# Patient Record
Sex: Male | Born: 1960
Health system: Southern US, Community
[De-identification: ages and names within clinical notes are randomized; demographics above are authoritative.]

## PROBLEM LIST (undated history)

## (undated) HISTORY — PX: COLONOSCOPY: SHX174

---

## 2013-12-12 ENCOUNTER — Other Ambulatory Visit: Payer: Self-pay | Admitting: Family Medicine

## 2013-12-12 ENCOUNTER — Ambulatory Visit
Admission: RE | Admit: 2013-12-12 | Discharge: 2013-12-12 | Disposition: A | Discharge status: Home / Self Care | Payer: BC Managed Care – PPO | Source: Ambulatory Visit | Attending: Family Medicine | Admitting: Family Medicine

## 2013-12-12 DIAGNOSIS — R0789 Other chest pain: Secondary | ICD-10-CM

## 2014-07-23 ENCOUNTER — Other Ambulatory Visit (INDEPENDENT_AMBULATORY_CARE_PROVIDER_SITE_OTHER): Payer: Self-pay | Admitting: General Surgery

## 2014-07-23 ENCOUNTER — Other Ambulatory Visit: Payer: Self-pay | Admitting: *Deleted

## 2014-07-23 DIAGNOSIS — K8020 Calculus of gallbladder without cholecystitis without obstruction: Secondary | ICD-10-CM

## 2014-07-23 NOTE — Addendum Note (Signed)
Addended by: Jaivion Kingsley M on: 07/23/2014 03:27 PM   Modules accepted: Orders  

## 2014-08-07 ENCOUNTER — Ambulatory Visit (HOSPITAL_COMMUNITY)
Admission: RE | Admit: 2014-08-07 | Discharge: 2014-08-07 | Disposition: A | Discharge status: Home / Self Care | Payer: BLUE CROSS/BLUE SHIELD | Source: Ambulatory Visit | Attending: General Surgery | Admitting: General Surgery

## 2014-08-07 DIAGNOSIS — K8020 Calculus of gallbladder without cholecystitis without obstruction: Secondary | ICD-10-CM | POA: Insufficient documentation

## 2014-08-07 MED ORDER — SINCALIDE 5 MCG IJ SOLR
INTRAMUSCULAR | Status: AC
  Administered 2014-08-07: 2.74 ug via INTRAVENOUS
  Filled 2014-08-07: qty 10

## 2014-08-07 MED ORDER — TECHNETIUM TC 99M MEBROFENIN IV KIT
5.0000 | PACK | Freq: Once | INTRAVENOUS | Status: AC | PRN
  Administered 2014-08-07: 5 via INTRAVENOUS

## 2014-08-07 MED ORDER — SINCALIDE 5 MCG IJ SOLR
0.0200 ug/kg | Freq: Once | INTRAMUSCULAR | Status: AC
  Administered 2014-08-07: 2.74 ug via INTRAVENOUS

## 2015-09-11 DIAGNOSIS — S70362A Insect bite (nonvenomous), left thigh, initial encounter: Secondary | ICD-10-CM | POA: Diagnosis not present

## 2015-09-11 DIAGNOSIS — J069 Acute upper respiratory infection, unspecified: Secondary | ICD-10-CM | POA: Diagnosis not present

## 2015-11-14 DIAGNOSIS — L308 Other specified dermatitis: Secondary | ICD-10-CM | POA: Diagnosis not present

## 2016-02-26 DIAGNOSIS — Z23 Encounter for immunization: Secondary | ICD-10-CM | POA: Diagnosis not present

## 2016-04-17 DIAGNOSIS — M25561 Pain in right knee: Secondary | ICD-10-CM | POA: Diagnosis not present

## 2016-04-17 DIAGNOSIS — M25562 Pain in left knee: Secondary | ICD-10-CM | POA: Diagnosis not present

## 2016-11-23 DIAGNOSIS — J209 Acute bronchitis, unspecified: Secondary | ICD-10-CM | POA: Diagnosis not present

## 2020-07-29 ENCOUNTER — Other Ambulatory Visit (HOSPITAL_COMMUNITY): Payer: Self-pay | Admitting: Family Medicine

## 2020-07-29 DIAGNOSIS — R1011 Right upper quadrant pain: Secondary | ICD-10-CM | POA: Diagnosis not present

## 2020-07-29 DIAGNOSIS — M545 Low back pain, unspecified: Secondary | ICD-10-CM | POA: Diagnosis not present

## 2020-08-07 ENCOUNTER — Other Ambulatory Visit: Payer: Self-pay

## 2020-08-07 ENCOUNTER — Ambulatory Visit (HOSPITAL_COMMUNITY)
Admission: RE | Admit: 2020-08-07 | Discharge: 2020-08-07 | Disposition: A | Discharge status: Home / Self Care | Payer: BC Managed Care – PPO | Source: Ambulatory Visit | Attending: Family Medicine | Admitting: Family Medicine

## 2020-08-07 DIAGNOSIS — K8020 Calculus of gallbladder without cholecystitis without obstruction: Secondary | ICD-10-CM | POA: Diagnosis not present

## 2020-08-07 DIAGNOSIS — R1011 Right upper quadrant pain: Secondary | ICD-10-CM | POA: Insufficient documentation

## 2020-09-11 ENCOUNTER — Ambulatory Visit: Payer: Self-pay | Admitting: Surgery

## 2020-09-11 DIAGNOSIS — K8010 Calculus of gallbladder with chronic cholecystitis without obstruction: Secondary | ICD-10-CM | POA: Diagnosis not present

## 2020-09-11 DIAGNOSIS — Z8379 Family history of other diseases of the digestive system: Secondary | ICD-10-CM | POA: Diagnosis not present

## 2020-10-03 NOTE — Patient Instructions (Addendum)
DUE TO COVID-19 ONLY ONE VISITOR IS ALLOWED TO COME WITH YOU AND STAY IN THE WAITING ROOM ONLY DURING PRE OP AND PROCEDURE DAY OF SURGERY. THE 1 VISITOR  MAY VISIT WITH YOU AFTER SURGERY IN YOUR PRIVATE ROOM DURING VISITING HOURS ONLY!  YOU NEED TO HAVE A COVID 19 TEST ON: 10/08/20 @ 9:00 AM  , THIS TEST MUST BE DONE BEFORE SURGERY,  COVID TESTING SITE [address] [place]  [id number], IT IS ON THE RIGHT GOING OUT [address] APPROXIMATELY  2 MINUTES PAST [employer] ON THE RIGHT. ONCE YOUR COVID TEST IS COMPLETED,  PLEASE BEGIN THE QUARANTINE INSTRUCTIONS AS OUTLINED IN YOUR HANDOUT.                Connor Wang   Your procedure is scheduled on: 10/11/20   Report to [facility] Main  Entrance   Report to admitting at: 7:15 AM     Call this number if you have problems the morning of surgery [phone]    Remember: Do not eat food or drink liquids :After Midnight.   BRUSH YOUR TEETH MORNING OF SURGERY AND RINSE YOUR MOUTH OUT, NO CHEWING GUM CANDY OR MINTS.    Take these medicines the morning of surgery with A SIP OF WATER:   DO NOT TAKE ANY DIABETIC MEDICATIONS DAY OF YOUR SURGERY                               You may not have any metal on your body including hair pins and              piercings  Do not wear jewelry, lotions, powders or perfumes, deodorant             Men may shave face and neck.   Do not bring valuables to the hospital. [facility] IS NOT             RESPONSIBLE   FOR VALUABLES.  Contacts, dentures or bridgework may not be worn into surgery.  Leave suitcase in the car. After surgery it may be brought to your room.     Patients discharged the day of surgery will not be allowed to drive home. IF YOU ARE HAVING SURGERY AND GOING HOME THE SAME DAY, YOU MUST HAVE AN ADULT TO DRIVE YOU HOME AND BE WITH YOU FOR 24 HOURS. YOU MAY GO HOME BY TAXI OR UBER OR ORTHERWISE, BUT AN ADULT MUST ACCOMPANY YOU HOME AND STAY WITH YOU FOR 24  HOURS.  Name and phone number of your driver:  Special Instructions: N/A              Please read over the following fact sheets you were given: _____________________________________________________________________          [facility] - Preparing for Surgery Before surgery, you can play an important role.  Because skin is not sterile, your skin needs to be as free of germs as possible.  You can reduce the number of germs on your skin by washing with CHG (chlorahexidine gluconate) soap before surgery.  CHG is an antiseptic cleaner which kills germs and bonds with the skin to continue killing germs even after washing. Please DO NOT use if you have an allergy to CHG or antibacterial soaps.  If your skin becomes reddened/irritated stop using the CHG and inform your nurse when you arrive at Short Stay. Do not shave (including  legs and underarms) for at least 48 hours prior to the first CHG shower.  You may shave your face/neck. Please follow these instructions carefully:  1.  Shower with CHG Soap the night before surgery and the  morning of Surgery.  2.  If you choose to wash your hair, wash your hair first as usual with your  normal  shampoo.  3.  After you shampoo, rinse your hair and body thoroughly to remove the  shampoo.                           4.  Use CHG as you would any other liquid soap.  You can apply chg directly  to the skin and wash                       Gently with a scrungie or clean washcloth.  5.  Apply the CHG Soap to your body ONLY FROM THE NECK DOWN.   Do not use on face/ open                           Wound or open sores. Avoid contact with eyes, ears mouth and genitals (private parts).                       Wash face,  Genitals (private parts) with your normal soap.             6.  Wash thoroughly, paying special attention to the area where your surgery  will be performed.  7.  Thoroughly rinse your body with warm water from the neck down.  8.  DO NOT shower/wash with your  normal soap after using and rinsing off  the CHG Soap.                9.  Pat yourself dry with a clean towel.            10.  Wear clean pajamas.            11.  Place clean sheets on your bed the night of your first shower and do not  sleep with pets. Day of Surgery : Do not apply any lotions/deodorants the morning of surgery.  Please wear clean clothes to the hospital/surgery center.  FAILURE TO FOLLOW THESE INSTRUCTIONS MAY RESULT IN THE CANCELLATION OF YOUR SURGERY PATIENT SIGNATURE_________________________________  NURSE SIGNATURE__________________________________  ________________________________________________________________________

## 2020-10-04 ENCOUNTER — Encounter (HOSPITAL_COMMUNITY)
Admission: RE | Admit: 2020-10-04 | Discharge: 2020-10-04 | Disposition: A | Discharge status: Home / Self Care | Payer: BC Managed Care – PPO | Source: Ambulatory Visit | Attending: Surgery | Admitting: Surgery

## 2020-10-04 ENCOUNTER — Other Ambulatory Visit: Payer: Self-pay

## 2020-10-04 ENCOUNTER — Encounter (HOSPITAL_COMMUNITY): Payer: Self-pay

## 2020-10-04 DIAGNOSIS — Z01812 Encounter for preprocedural laboratory examination: Secondary | ICD-10-CM | POA: Insufficient documentation

## 2020-10-04 LAB — CBC
HCT: 45.3 % (ref 39.0–52.0)
Hemoglobin: 15.1 g/dL (ref 13.0–17.0)
MCH: 30.2 pg (ref 26.0–34.0)
MCHC: 33.3 g/dL (ref 30.0–36.0)
MCV: 90.6 fL (ref 80.0–100.0)
Platelets: 206 10*3/uL (ref 150–400)
RBC: 5 MIL/uL (ref 4.22–5.81)
RDW: 12.9 % (ref 11.5–15.5)
WBC: 7.9 10*3/uL (ref 4.0–10.5)
nRBC: 0 % (ref 0.0–0.2)

## 2020-10-04 NOTE — Progress Notes (Signed)
COVID Vaccine Completed: Date COVID Vaccine completed: COVID vaccine manufacturer: Pfizer    Moderna   [employer]   PCP - Dr. Leodis Sias Cardiologist -   Chest x-ray -  EKG -  Stress Test -  ECHO -  Cardiac Cath -  Pacemaker/ICD device last checked:  Sleep Study -  CPAP -   Fasting Blood Sugar -  Checks Blood Sugar _____ times a day  Blood Thinner Instructions: Aspirin Instructions: Last Dose:  Anesthesia review:   Patient denies shortness of breath, fever, cough and chest pain at PAT appointment   Patient verbalized understanding of instructions that were given to them at the PAT appointment. Patient was also instructed that they will need to review over the PAT instructions again at home before surgery.

## 2020-10-04 NOTE — Progress Notes (Signed)
Pt. Refuses any blood product.

## 2020-10-06 ENCOUNTER — Encounter (HOSPITAL_COMMUNITY): Payer: Self-pay | Admitting: Surgery

## 2020-10-06 DIAGNOSIS — K8010 Calculus of gallbladder with chronic cholecystitis without obstruction: Secondary | ICD-10-CM | POA: Diagnosis present

## 2020-10-06 NOTE — H&P (Signed)
General Surgery [facility]  Connor Wang DOB: 03-02-1961 Married / Language: [place] / Race: White Male   History of Present Illness  The patient is a 60 year old male who presents for evaluation of gall stones.  CHIEF COMPLAINT: symptomatic cholelithiasis, chronic cholecystitis  Patient is referred by Dr. Leodis Sias for surgical evaluation and management of symptomatic cholelithiasis and chronic cholecystitis. Patient began having symptoms approximately 3 months ago. He describes intermittent right upper quadrant abdominal pain radiating to the back. Patient underwent ultrasound on August 07, 2020. This showed multiple gallstones. There was also prominence of the common bile duct at 8 mm. There was increased echogenicity in the liver consistent with steatosis. Patient has had no prior abdominal surgery. There is a family history of cholecystectomy and the patient's mother. Patient denies any history of hepatobiliary or pancreatic disease. He denies any history of jaundice. He works in Consulting civil engineer for [employer].   Problem List/Past Medical GALLSTONES (K80.20)  BACK PAIN WITHOUT RADIATION (M54.9)  BMI 40.0-44.9, ADULT (Z68.41)  FAMILY HISTORY OF CHOLECYSTECTOMY (Z83.79)  CHOLELITHIASIS WITH CHRONIC CHOLECYSTITIS (K80.10)   Past Surgical History  Colon Polyp Removal - Colonoscopy  Oral Surgery   Diagnostic Studies History Colonoscopy  5-10 years ago within last year  Allergies  No Known Drug Allergies  Allergies Reconciled   Medication History  No Current Medications Medications Reconciled  Social History  Alcohol use  Occasional alcohol use. Caffeine use  Coffee, Tea. Tobacco use  Never smoker.  Family History  Arthritis  Mother. Cancer  Father, Son. Cerebrovascular Accident  Father. Hypertension  Mother. Kidney Disease  Father. Arthritis  Father, Mother. Depression  Daughter, Son. Heart Disease   Mother. Respiratory Condition  Sister.  Other Problems  No pertinent past medical history  Back Pain  Cholelithiasis  Diverticulosis  High blood pressure  Hypercholesterolemia  Other disease, cancer, significant illness  Sleep Apnea   Review of Systems  General Not Present- Appetite Loss, Chills, Fatigue, Fever, Night Sweats, Weight Gain and Weight Loss. Skin Not Present- Change in Wart/Mole, Dryness, Hives, Jaundice, New Lesions, Non-Healing Wounds, Rash and Ulcer. HEENT Present- Wears glasses/contact lenses. Not Present- Earache, Hearing Loss, Hoarseness, Nose Bleed, Oral Ulcers, Ringing in the Ears, Seasonal Allergies, Sinus Pain, Sore Throat, Visual Disturbances and Yellow Eyes. Respiratory Present- Snoring. Not Present- Bloody sputum, Chronic Cough, Difficulty Breathing and Wheezing. Breast Not Present- Breast Mass, Breast Pain, Nipple Discharge and Skin Changes. Cardiovascular Present- Rapid Heart Rate. Not Present- Chest Pain, Difficulty Breathing Lying Down, Leg Cramps, Palpitations, Shortness of Breath and Swelling of Extremities. Gastrointestinal Present- Abdominal Pain. Not Present- Bloating, Bloody Stool, Change in Bowel Habits, Chronic diarrhea, Constipation, Difficulty Swallowing, Excessive gas, Gets full quickly at meals, Hemorrhoids, Indigestion, Nausea, Rectal Pain and Vomiting. Male Genitourinary Not Present- Blood in Urine, Change in Urinary Stream, Frequency, Impotence, Nocturia, Painful Urination, Urgency and Urine Leakage. Musculoskeletal Present- Joint Pain and Joint Stiffness. Not Present- Back Pain, Muscle Pain, Muscle Weakness and Swelling of Extremities. Neurological Present- Numbness and Tingling. Not Present- Decreased Memory, Fainting, Headaches, Seizures, Tremor, Trouble walking and Weakness. Psychiatric Not Present- Anxiety, Bipolar, Change in Sleep Pattern, Depression, Fearful and Frequent crying. Hematology Not Present- Blood Thinners, Easy  Bruising, Excessive bleeding, Gland problems, HIV and Persistent Infections.  Vitals  Weight: 276.25 lb Height: 70in Body Surface Area: 2.39 m Body Mass Index: 39.64 kg/m  Temp.: 97.33F  Pulse: 59 (Regular)  P.OX: 98% (Room air) BP: 140/80(Sitting, Left Arm, Standard)  Physical Exam  GENERAL APPEARANCE Development: normal Nutritional status: normal Gross deformities: none  SKIN Rash, lesions, ulcers: none Induration, erythema: none Nodules: none palpable  EYES Conjunctiva and lids: normal Pupils: equal and reactive Iris: normal bilaterally  EARS, NOSE, MOUTH, THROAT External ears: no lesion or deformity External nose: no lesion or deformity Hearing: grossly normal Due to Covid-19 pandemic, patient is wearing a mask.  NECK Symmetric: yes Trachea: midline Thyroid: no palpable nodules in the thyroid bed  CHEST Respiratory effort: normal Retraction or accessory muscle use: no Breath sounds: normal bilaterally Rales, rhonchi, wheeze: none  CARDIOVASCULAR Auscultation: regular rhythm, normal rate Murmurs: none Pulses: radial pulse 2+ palpable Lower extremity edema: none  ABDOMEN Distension: none Masses: none palpable Tenderness: Mild tenderness to palpation right upper quadrant without guarding Hepatosplenomegaly: not present Hernia: not present; mild rectus diastasis upper midline abdominal wall  MUSCULOSKELETAL Station and gait: normal Digits and nails: no clubbing or cyanosis Muscle strength: grossly normal all extremities Range of motion: grossly normal all extremities Deformity: none  LYMPHATIC Cervical: none palpable Supraclavicular: none palpable  PSYCHIATRIC Oriented to person, place, and time: yes Mood and affect: normal for situation Judgment and insight: appropriate for situation    Assessment & Plan   CHOLELITHIASIS WITH CHRONIC CHOLECYSTITIS (K80.10) FAMILY HISTORY OF CHOLECYSTECTOMY (Z83.79)  Patient is referred by  his primary care physician for surgical evaluation and management of symptomatic cholelithiasis and chronic cholecystitis.  Patient is provided with written literature on laparoscopic cholecystectomy to review at home.  Ultrasound demonstrates multiple gallstones as well as a mildly prominent common bile duct. I have recommended proceeding with laparoscopic cholecystectomy with intraoperative cholangiography. We have discussed the procedure. We have discussed the size and location of the surgical incisions. We discussed the possibility of conversion to open surgery. We discussed the hospital stay to be anticipated. The patient understands and wishes to proceed with surgery in the near future.  The risks and benefits of the procedure have been discussed at length with the patient. The patient understands the proposed procedure, potential alternative treatments, and the course of recovery to be expected. All of the patient's questions have been answered at this time. The patient wishes to proceed with surgery.  Darnell Level, MD [facility] Office: [phone]

## 2020-10-08 ENCOUNTER — Other Ambulatory Visit (HOSPITAL_COMMUNITY)
Admission: RE | Admit: 2020-10-08 | Discharge: 2020-10-08 | Disposition: A | Discharge status: Home / Self Care | Payer: BC Managed Care – PPO | Source: Ambulatory Visit | Attending: Surgery | Admitting: Surgery

## 2020-10-08 DIAGNOSIS — Z20822 Contact with and (suspected) exposure to covid-19: Secondary | ICD-10-CM | POA: Diagnosis not present

## 2020-10-08 DIAGNOSIS — Z01812 Encounter for preprocedural laboratory examination: Secondary | ICD-10-CM | POA: Insufficient documentation

## 2020-10-08 LAB — SARS CORONAVIRUS 2 (TAT 6-24 HRS): SARS Coronavirus 2: NEGATIVE

## 2020-10-10 ENCOUNTER — Encounter (HOSPITAL_COMMUNITY): Payer: Self-pay | Admitting: Surgery

## 2020-10-10 NOTE — Anesthesia Preprocedure Evaluation (Addendum)
Anesthesia Evaluation  Patient identified by MRN, date of birth, ID band Patient awake    Reviewed: Allergy & Precautions, NPO status , Patient's Chart, lab work & pertinent test results  History of Anesthesia Complications Negative for: history of anesthetic complications  Airway Mallampati: III  TM Distance: >3 FB Neck ROM: Full    Dental no notable dental hx.    Pulmonary neg pulmonary ROS,    Pulmonary exam normal        Cardiovascular negative cardio ROS Normal cardiovascular exam     Neuro/Psych negative neurological ROS     GI/Hepatic Neg liver ROS,   Endo/Other  negative endocrine ROS  Renal/GU negative Renal ROS     Musculoskeletal negative musculoskeletal ROS (+)   Abdominal   Peds  Hematology negative hematology ROS (+)   Anesthesia Other Findings   Reproductive/Obstetrics                            Anesthesia Physical Anesthesia Plan  ASA: 2  Anesthesia Plan: General   Post-op Pain Management:    Induction: Intravenous  PONV Risk Score and Plan: 4 or greater and Ondansetron, Dexamethasone, Midazolam and Scopolamine patch - Pre-op  Airway Management Planned:   Additional Equipment:   Intra-op Plan:   Post-operative Plan: Extubation in OR  Informed Consent: I have reviewed the patients History and Physical, chart, labs and discussed the procedure including the risks, benefits and alternatives for the proposed anesthesia with the patient or authorized representative who has indicated his/her understanding and acceptance.     Dental advisory given  Plan Discussed with: Anesthesiologist, CRNA and Surgeon  Anesthesia Plan Comments:        Anesthesia Quick Evaluation

## 2020-10-11 ENCOUNTER — Ambulatory Visit (HOSPITAL_COMMUNITY): Payer: BC Managed Care – PPO

## 2020-10-11 ENCOUNTER — Ambulatory Visit (HOSPITAL_COMMUNITY): Payer: BC Managed Care – PPO | Admitting: Anesthesiology

## 2020-10-11 ENCOUNTER — Encounter (HOSPITAL_COMMUNITY)
Admission: RE | Disposition: A | Discharge status: Home / Self Care | Payer: Self-pay | Source: Ambulatory Visit | Attending: Surgery

## 2020-10-11 ENCOUNTER — Ambulatory Visit (HOSPITAL_COMMUNITY)
Admission: RE | Admit: 2020-10-11 | Discharge: 2020-10-12 | Disposition: A | Discharge status: Home / Self Care | Payer: BC Managed Care – PPO | Source: Ambulatory Visit | Attending: Surgery | Admitting: Surgery

## 2020-10-11 ENCOUNTER — Encounter (HOSPITAL_COMMUNITY): Payer: Self-pay | Admitting: Surgery

## 2020-10-11 ENCOUNTER — Other Ambulatory Visit: Payer: Self-pay

## 2020-10-11 DIAGNOSIS — Z48815 Encounter for surgical aftercare following surgery on the digestive system: Secondary | ICD-10-CM | POA: Diagnosis not present

## 2020-10-11 DIAGNOSIS — E7800 Pure hypercholesterolemia, unspecified: Secondary | ICD-10-CM | POA: Insufficient documentation

## 2020-10-11 DIAGNOSIS — G4730 Sleep apnea, unspecified: Secondary | ICD-10-CM | POA: Diagnosis not present

## 2020-10-11 DIAGNOSIS — Z8379 Family history of other diseases of the digestive system: Secondary | ICD-10-CM | POA: Insufficient documentation

## 2020-10-11 DIAGNOSIS — Z886 Allergy status to analgesic agent status: Secondary | ICD-10-CM | POA: Diagnosis not present

## 2020-10-11 DIAGNOSIS — Z8719 Personal history of other diseases of the digestive system: Secondary | ICD-10-CM | POA: Diagnosis not present

## 2020-10-11 DIAGNOSIS — K8020 Calculus of gallbladder without cholecystitis without obstruction: Secondary | ICD-10-CM

## 2020-10-11 DIAGNOSIS — Z9049 Acquired absence of other specified parts of digestive tract: Secondary | ICD-10-CM | POA: Insufficient documentation

## 2020-10-11 DIAGNOSIS — K8010 Calculus of gallbladder with chronic cholecystitis without obstruction: Secondary | ICD-10-CM | POA: Diagnosis not present

## 2020-10-11 DIAGNOSIS — Z809 Family history of malignant neoplasm, unspecified: Secondary | ICD-10-CM | POA: Diagnosis not present

## 2020-10-11 DIAGNOSIS — Z8249 Family history of ischemic heart disease and other diseases of the circulatory system: Secondary | ICD-10-CM | POA: Diagnosis not present

## 2020-10-11 HISTORY — PX: CHOLECYSTECTOMY: SHX55

## 2020-10-11 SURGERY — LAPAROSCOPIC CHOLECYSTECTOMY WITH INTRAOPERATIVE CHOLANGIOGRAM
Anesthesia: General | Site: Abdomen

## 2020-10-11 MED ORDER — MIDAZOLAM HCL 5 MG/5ML IJ SOLN
INTRAMUSCULAR | Status: DC | PRN
  Administered 2020-10-11: 2 mg via INTRAVENOUS

## 2020-10-11 MED ORDER — BUPIVACAINE-EPINEPHRINE (PF) 0.5% -1:200000 IJ SOLN
INTRAMUSCULAR | Status: AC
  Filled 2020-10-11: qty 30

## 2020-10-11 MED ORDER — LIDOCAINE 2% (20 MG/ML) 5 ML SYRINGE
INTRAMUSCULAR | Status: DC | PRN
  Administered 2020-10-11: 100 mg via INTRAVENOUS

## 2020-10-11 MED ORDER — DEXAMETHASONE SODIUM PHOSPHATE 10 MG/ML IJ SOLN
INTRAMUSCULAR | Status: DC | PRN
  Administered 2020-10-11: 10 mg via INTRAVENOUS

## 2020-10-11 MED ORDER — SUGAMMADEX SODIUM 500 MG/5ML IV SOLN
INTRAVENOUS | Status: DC | PRN
  Administered 2020-10-11: 300 mg via INTRAVENOUS

## 2020-10-11 MED ORDER — EPHEDRINE SULFATE-NACL 50-0.9 MG/10ML-% IV SOSY
PREFILLED_SYRINGE | INTRAVENOUS | Status: DC | PRN
  Administered 2020-10-11 (×3): 10 mg via INTRAVENOUS

## 2020-10-11 MED ORDER — CEFAZOLIN IN SODIUM CHLORIDE 3-0.9 GM/100ML-% IV SOLN
3.0000 g | INTRAVENOUS | Status: AC
  Administered 2020-10-11: 3 g via INTRAVENOUS
  Filled 2020-10-11: qty 100

## 2020-10-11 MED ORDER — HYDROMORPHONE HCL 1 MG/ML IJ SOLN: 1.0000 mg | INTRAMUSCULAR | Status: DC | PRN

## 2020-10-11 MED ORDER — 0.9 % SODIUM CHLORIDE (POUR BTL) OPTIME
TOPICAL | Status: DC | PRN
  Administered 2020-10-11: 1000 mL

## 2020-10-11 MED ORDER — EPHEDRINE 5 MG/ML INJ
INTRAVENOUS | Status: AC
  Filled 2020-10-11: qty 10

## 2020-10-11 MED ORDER — PROMETHAZINE HCL 25 MG/ML IJ SOLN: 6.2500 mg | INTRAMUSCULAR | Status: DC | PRN

## 2020-10-11 MED ORDER — CHLORHEXIDINE GLUCONATE CLOTH 2 % EX PADS: 6.0000 | MEDICATED_PAD | Freq: Once | CUTANEOUS | Status: DC

## 2020-10-11 MED ORDER — CHLORHEXIDINE GLUCONATE 0.12 % MT SOLN
15.0000 mL | Freq: Once | OROMUCOSAL | Status: AC
  Administered 2020-10-11: 15 mL via OROMUCOSAL

## 2020-10-11 MED ORDER — ONDANSETRON HCL 4 MG/2ML IJ SOLN: 4.0000 mg | Freq: Four times a day (QID) | INTRAMUSCULAR | Status: DC | PRN

## 2020-10-11 MED ORDER — FENTANYL CITRATE (PF) 100 MCG/2ML IJ SOLN
INTRAMUSCULAR | Status: AC
  Filled 2020-10-11: qty 2

## 2020-10-11 MED ORDER — FENTANYL CITRATE (PF) 100 MCG/2ML IJ SOLN: 25.0000 ug | INTRAMUSCULAR | Status: DC | PRN

## 2020-10-11 MED ORDER — OXYCODONE HCL 5 MG PO TABS: 5.0000 mg | ORAL_TABLET | ORAL | Status: DC | PRN

## 2020-10-11 MED ORDER — ACETAMINOPHEN 325 MG PO TABS: 650.0000 mg | ORAL_TABLET | Freq: Four times a day (QID) | ORAL | Status: DC | PRN

## 2020-10-11 MED ORDER — LACTATED RINGERS IR SOLN
Status: DC | PRN
  Administered 2020-10-11: 1000 mL

## 2020-10-11 MED ORDER — ORAL CARE MOUTH RINSE: 15.0000 mL | Freq: Once | OROMUCOSAL | Status: AC

## 2020-10-11 MED ORDER — GLYCOPYRROLATE PF 0.2 MG/ML IJ SOSY
PREFILLED_SYRINGE | INTRAMUSCULAR | Status: AC
  Filled 2020-10-11: qty 1

## 2020-10-11 MED ORDER — MIDAZOLAM HCL 2 MG/2ML IJ SOLN
INTRAMUSCULAR | Status: AC
  Filled 2020-10-11: qty 2

## 2020-10-11 MED ORDER — ACETAMINOPHEN 650 MG RE SUPP: 650.0000 mg | Freq: Four times a day (QID) | RECTAL | Status: DC | PRN

## 2020-10-11 MED ORDER — ONDANSETRON 4 MG PO TBDP: 4.0000 mg | ORAL_TABLET | Freq: Four times a day (QID) | ORAL | Status: DC | PRN

## 2020-10-11 MED ORDER — AMISULPRIDE (ANTIEMETIC) 5 MG/2ML IV SOLN: 10.0000 mg | Freq: Once | INTRAVENOUS | Status: DC | PRN

## 2020-10-11 MED ORDER — PROPOFOL 10 MG/ML IV BOLUS
INTRAVENOUS | Status: AC
  Filled 2020-10-11: qty 40

## 2020-10-11 MED ORDER — BUPIVACAINE-EPINEPHRINE 0.5% -1:200000 IJ SOLN
INTRAMUSCULAR | Status: DC | PRN
  Administered 2020-10-11: 24 mL

## 2020-10-11 MED ORDER — PROPOFOL 10 MG/ML IV BOLUS
INTRAVENOUS | Status: DC | PRN
  Administered 2020-10-11: 170 mg via INTRAVENOUS

## 2020-10-11 MED ORDER — ROCURONIUM BROMIDE 10 MG/ML (PF) SYRINGE
PREFILLED_SYRINGE | INTRAVENOUS | Status: DC | PRN
  Administered 2020-10-11: 100 mg via INTRAVENOUS

## 2020-10-11 MED ORDER — ONDANSETRON HCL 4 MG/2ML IJ SOLN
INTRAMUSCULAR | Status: DC | PRN
  Administered 2020-10-11: 4 mg via INTRAVENOUS

## 2020-10-11 MED ORDER — TRAMADOL HCL 50 MG PO TABS
50.0000 mg | ORAL_TABLET | Freq: Four times a day (QID) | ORAL | Status: DC | PRN
Start: 2020-10-11 — End: 2020-10-12

## 2020-10-11 MED ORDER — IOHEXOL 300 MG/ML  SOLN
INTRAMUSCULAR | Status: DC | PRN
  Administered 2020-10-11: 7.5 mL

## 2020-10-11 MED ORDER — FENTANYL CITRATE (PF) 100 MCG/2ML IJ SOLN
INTRAMUSCULAR | Status: DC | PRN
  Administered 2020-10-11: 50 ug via INTRAVENOUS
  Administered 2020-10-11: 100 ug via INTRAVENOUS
  Administered 2020-10-11: 50 ug via INTRAVENOUS

## 2020-10-11 MED ORDER — SODIUM CHLORIDE 0.45 % IV SOLN: INTRAVENOUS | Status: DC

## 2020-10-11 MED ORDER — GLYCOPYRROLATE PF 0.2 MG/ML IJ SOSY
PREFILLED_SYRINGE | INTRAMUSCULAR | Status: DC | PRN
  Administered 2020-10-11: .2 mg via INTRAVENOUS

## 2020-10-11 MED ORDER — LACTATED RINGERS IV SOLN: INTRAVENOUS | Status: DC

## 2020-10-11 SURGICAL SUPPLY — 35 items
APPLIER CLIP ROT 10 11.4 M/L (STAPLE) ×2
CABLE HIGH FREQUENCY MONO STRZ (ELECTRODE) ×2 IMPLANT
CHLORAPREP W/TINT 26 (MISCELLANEOUS) ×2 IMPLANT
CLIP APPLIE ROT 10 11.4 M/L (STAPLE) ×1 IMPLANT
COVER MAYO STAND STRL (DRAPES) ×2 IMPLANT
COVER SURGICAL LIGHT HANDLE (MISCELLANEOUS) ×2 IMPLANT
COVER WAND RF STERILE (DRAPES) IMPLANT
DECANTER SPIKE VIAL GLASS SM (MISCELLANEOUS) IMPLANT
DERMABOND ADVANCED (GAUZE/BANDAGES/DRESSINGS) ×1
DERMABOND ADVANCED .7 DNX12 (GAUZE/BANDAGES/DRESSINGS) ×1 IMPLANT
DRAPE C-ARM 42X120 X-RAY (DRAPES) ×2 IMPLANT
ELECT PENCIL ROCKER SW 15FT (MISCELLANEOUS) ×2 IMPLANT
ELECT REM PT RETURN 15FT ADLT (MISCELLANEOUS) ×2 IMPLANT
GAUZE SPONGE 2X2 8PLY STRL LF (GAUZE/BANDAGES/DRESSINGS) ×1 IMPLANT
GLOVE SURG ORTHO LTX SZ8 (GLOVE) ×2 IMPLANT
GOWN STRL REUS W/TWL XL LVL3 (GOWN DISPOSABLE) ×4 IMPLANT
HEMOSTAT SURGICEL 4X8 (HEMOSTASIS) IMPLANT
KIT BASIN OR (CUSTOM PROCEDURE TRAY) ×2 IMPLANT
KIT TURNOVER KIT A (KITS) ×2 IMPLANT
PENCIL SMOKE EVACUATOR (MISCELLANEOUS) IMPLANT
POUCH SPECIMEN RETRIEVAL 10MM (ENDOMECHANICALS) ×2 IMPLANT
SCISSORS LAP 5X35 DISP (ENDOMECHANICALS) ×2 IMPLANT
SET CHOLANGIOGRAPH MIX (MISCELLANEOUS) ×2 IMPLANT
SET IRRIG TUBING LAPAROSCOPIC (IRRIGATION / IRRIGATOR) ×2 IMPLANT
SET TUBE SMOKE EVAC HIGH FLOW (TUBING) IMPLANT
SLEEVE XCEL OPT CAN 5 100 (ENDOMECHANICALS) ×2 IMPLANT
SPONGE GAUZE 2X2 STER 10/PKG (GAUZE/BANDAGES/DRESSINGS) ×1
STRIP CLOSURE SKIN 1/2X4 (GAUZE/BANDAGES/DRESSINGS) IMPLANT
SUT MNCRL AB 4-0 PS2 18 (SUTURE) ×4 IMPLANT
TOWEL OR 17X26 10 PK STRL BLUE (TOWEL DISPOSABLE) ×2 IMPLANT
TOWEL OR NON WOVEN STRL DISP B (DISPOSABLE) ×2 IMPLANT
TRAY LAPAROSCOPIC (CUSTOM PROCEDURE TRAY) ×2 IMPLANT
TROCAR BLADELESS OPT 5 100 (ENDOMECHANICALS) ×2 IMPLANT
TROCAR XCEL BLUNT TIP 100MML (ENDOMECHANICALS) ×2 IMPLANT
TROCAR XCEL NON-BLD 11X100MML (ENDOMECHANICALS) ×2 IMPLANT

## 2020-10-11 NOTE — Interval H&P Note (Signed)
History and Physical Interval Note:  10/11/2020 8:36 AM  Connor Wang  has presented today for surgery, with the diagnosis of CHRONIC CHOLECYSTITIS, CHOLELITHIASIS.  The various methods of treatment have been discussed with the patient and family. After consideration of risks, benefits and other options for treatment, the patient has consented to    Procedure(s): LAPAROSCOPIC CHOLECYSTECTOMY WITH INTRAOPERATIVE CHOLANGIOGRAM (N/A) as a surgical intervention.    The patient's history has been reviewed, patient examined, no change in status, stable for surgery.  I have reviewed the patient's chart and labs.  Questions were answered to the patient's satisfaction.    Darnell Level, MD [facility] Office: [phone]   Darnell Level

## 2020-10-11 NOTE — Op Note (Addendum)
Laparoscopic Cholecystectomy with IOC Procedure Note  Indications: This patient presents with symptomatic gallbladder disease and will undergo laparoscopic cholecystectomy.  Pre-operative Diagnosis: Symptomatic cholelithiasis  Post-operative Diagnosis: Chronic cholecystitis with cholelithiasis  Resident Surgeon: Lissa Morales, MD  Attending Surgeon: Darnell Level, MD  I was present for the critical and key portions of the surgery and I was immediately available throughout the entire procedure.  I have reviewed and agree with the operative note as documented by the resident.  Procedure Details  The patient was seen again in the Holding Room. The risks, benefits, complications, treatment options, and expected outcomes were discussed with the patient. The possibilities of reaction to medication, pulmonary aspiration, perforation of viscus, bleeding, recurrent infection, finding a normal gallbladder, the need for additional procedures, failure to diagnose a condition, the possible need to convert to an open procedure, and creating a complication requiring transfusion or operation were discussed with the patient. The likelihood of improving the patient's symptoms with return to their baseline status is good.  The patient and/or family concurred with the proposed plan, giving informed consent. The site of surgery properly noted. The patient was taken to Operating Room, identified as Shaheer Bonfield and the procedure verified as Laparoscopic Cholecystectomy with Intraoperative Cholangiogram. A Time Out was held and the above information confirmed.  Prior to the induction of general anesthesia, antibiotic prophylaxis was administered. General endotracheal anesthesia was then administered and tolerated well. After the induction, the abdomen was prepped with Chloraprep and draped in the sterile fashion. The patient was positioned in the supine position.  A transverse infraumbilical incision was made. We  dissected down to the abdominal fascia with blunt dissection.  The fascia was incised vertically and we entered the peritoneal cavity bluntly.  A pursestring suture of 0-Vicryl was placed around the fascial opening.  The Hasson cannula was inserted and secured with the stay suture.  Pneumoperitoneum was then created with CO2 and tolerated well without any adverse changes in the patient's vital signs. An 11-mm port was placed in the subxiphoid position.  Two 5-mm ports were placed in the right upper quadrant. All skin incisions were infiltrated with a local anesthetic agent before making the incision and placing the trocars.   We positioned the patient in reverse Trendelenburg, tilted slightly to the patient's left.  The gallbladder was identified, the fundus grasped and retracted cephalad. Adhesions were lysed bluntly and with the electrocautery where indicated, taking care not to injure any adjacent organs or viscus. There was evidence of chronic cholecystitis The infundibulum was grasped and retracted laterally, exposing the peritoneum overlying the triangle of Calot. This was then divided and exposed in a blunt fashion. A critical view of the cystic duct and cystic artery was obtained.  The cystic duct was clearly identified and bluntly dissected circumferentially. The cystic duct was ligated with a clip distally.   An incision was made in the cystic duct and the [facility] cholangiogram catheter introduced. The catheter was secured using a clip. A cholangiogram was then obtained which showed good visualization of the distal and proximal biliary tree with no sign of filling defects or obstruction.  Contrast flowed easily into the duodenum. The catheter was then removed.   The cystic duct was then ligated with clips and divided. The cystic artery was identified, dissected free, ligated with clips and divided as well.   The gallbladder was dissected from the liver bed in retrograde fashion with the electrocautery.  The gallbladder was removed and placed in an Endocatch  sac. The liver bed was irrigated and inspected. Hemostasis was achieved with the electrocautery. Copious irrigation was utilized and was repeatedly aspirated until clear.  The gallbladder and Endocatch sac were then removed through the umbilical port site.  The pursestring suture was used to close the umbilical fascia.    We again inspected the right upper quadrant for hemostasis.  Pneumoperitoneum was released as we removed the trocars.  4-0 Monocryl was used to close the skin. Dermabond was applied. The patient was then extubated and brought to the recovery room in stable condition. Instrument, sponge, and needle counts were correct at closure and at the conclusion of the case.   Findings: Cholecystitis with Cholelithiasis  Estimated Blood Loss: Minimal         Drains: none         Specimens: Gallbladder           Complications: None; patient tolerated the procedure well.         Disposition: PACU - hemodynamically stable.         Condition: stable

## 2020-10-11 NOTE — Transfer of Care (Signed)
Immediate Anesthesia Transfer of Care Note  Patient: Connor Wang  Procedure(s) Performed: Procedure(s): LAPAROSCOPIC CHOLECYSTECTOMY WITH INTRAOPERATIVE CHOLANGIOGRAM (N/A)  Patient Location: PACU  Anesthesia Type:General  Level of Consciousness:  sedated, patient cooperative and responds to stimulation  Airway & Oxygen Therapy:Patient Spontanous Breathing and Patient connected to face mask oxgen  Post-op Assessment:  Report given to PACU RN and Post -op Vital signs reviewed and stable  Post vital signs:  Reviewed and stable  Last Vitals:  Vitals:   10/11/20 0747  BP: 132/81  Pulse: (!) 48  Resp: 16  Temp: 36.7 C  SpO2: 98%    Complications: No apparent anesthesia complications

## 2020-10-11 NOTE — Plan of Care (Signed)
  Problem: Education: Goal: Knowledge of General Education information will improve Description: Including pain rating scale, medication(s)/side effects and non-pharmacologic comfort measures Outcome: Progressing   Problem: Skin Integrity: Goal: Risk for impaired skin integrity will decrease Outcome: Progressing   Problem: Safety: Goal: Ability to remain free from injury will improve Outcome: Progressing   

## 2020-10-11 NOTE — Anesthesia Postprocedure Evaluation (Signed)
Anesthesia Post Note  Patient: Connor Wang  Procedure(s) Performed: LAPAROSCOPIC CHOLECYSTECTOMY WITH INTRAOPERATIVE CHOLANGIOGRAM (Abdomen)     Patient location during evaluation: PACU Anesthesia Type: General Level of consciousness: sedated Pain management: pain level controlled Vital Signs Assessment: post-procedure vital signs reviewed and stable Respiratory status: spontaneous breathing and respiratory function stable Cardiovascular status: stable Postop Assessment: no apparent nausea or vomiting Anesthetic complications: no   No notable events documented.  Last Vitals:  Vitals:   10/11/20 1140 10/11/20 1142  BP:    Pulse: (!) 51 (!) 56  Resp: 20 16  Temp:  37.1 C  SpO2: 100% 100%    Last Pain:  Vitals:   10/11/20 1130  TempSrc:   PainSc: 4                  Candelaria Pies DANIEL

## 2020-10-11 NOTE — Anesthesia Procedure Notes (Signed)
Procedure Name: Intubation Date/Time: 10/11/2020 9:19 AM Performed by: Lavina Hamman, CRNA Pre-anesthesia Checklist: Patient identified, Emergency Drugs available, Suction available, Patient being monitored and Timeout performed Patient Re-evaluated:Patient Re-evaluated prior to induction Oxygen Delivery Method: Circle system utilized Preoxygenation: Pre-oxygenation with 100% oxygen Induction Type: IV induction Ventilation: Mask ventilation without difficulty Laryngoscope Size: Mac and 4 Grade View: Grade I Tube type: Oral Tube size: 7.5 mm Number of attempts: 1 Airway Equipment and Method: Stylet Placement Confirmation: ETT inserted through vocal cords under direct vision, positive ETCO2, CO2 detector and breath sounds checked- equal and bilateral Secured at: 22 cm Tube secured with: Tape Dental Injury: Teeth and Oropharynx as per pre-operative assessment  Comments: ATOI

## 2020-10-12 ENCOUNTER — Encounter (HOSPITAL_COMMUNITY): Payer: Self-pay | Admitting: Surgery

## 2020-10-12 DIAGNOSIS — E7800 Pure hypercholesterolemia, unspecified: Secondary | ICD-10-CM | POA: Diagnosis not present

## 2020-10-12 DIAGNOSIS — G4730 Sleep apnea, unspecified: Secondary | ICD-10-CM | POA: Diagnosis not present

## 2020-10-12 DIAGNOSIS — Z886 Allergy status to analgesic agent status: Secondary | ICD-10-CM | POA: Diagnosis not present

## 2020-10-12 DIAGNOSIS — K8010 Calculus of gallbladder with chronic cholecystitis without obstruction: Secondary | ICD-10-CM | POA: Diagnosis not present

## 2020-10-12 DIAGNOSIS — Z8719 Personal history of other diseases of the digestive system: Secondary | ICD-10-CM | POA: Diagnosis not present

## 2020-10-12 DIAGNOSIS — Z8249 Family history of ischemic heart disease and other diseases of the circulatory system: Secondary | ICD-10-CM | POA: Diagnosis not present

## 2020-10-12 DIAGNOSIS — Z8379 Family history of other diseases of the digestive system: Secondary | ICD-10-CM | POA: Diagnosis not present

## 2020-10-12 DIAGNOSIS — Z809 Family history of malignant neoplasm, unspecified: Secondary | ICD-10-CM | POA: Diagnosis not present
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
## 2020-10-12 NOTE — Plan of Care (Signed)
Instructions were reviewed with patient. All questions were answered. Patient was transported to main entrance by wheelchair. ° °

## 2020-10-12 NOTE — Discharge Instructions (Signed)
[facility]  LAPAROSCOPIC SURGERY:  POST-OP INSTRUCTIONS  Always review your discharge instruction sheet given to you by the facility where your surgery was performed.  A prescription for pain medication may be given to you upon discharge.  Take your pain medication as prescribed.  If narcotic pain medicine is not needed, then you may take acetaminophen (Tylenol) or ibuprofen (Advil) as needed.  Take your usually prescribed medications unless otherwise directed.  If you need a refill on your pain medication, please contact your pharmacy.  They will contact our office to request authorization. Prescriptions will not be filled after 5 P.M. or on weekends.  You should follow a light diet the first few days after arrival home, such as soup and crackers or toast.  Be sure to include plenty of fluids daily.  Most patients will experience some swelling and bruising in the area of the incisions.  Ice packs will help.  Swelling and bruising can take several days to resolve.   It is common to experience some constipation after surgery.  Increasing fluid intake and taking a stool softener (such as Colace) will usually help or prevent this problem from occurring.  A mild laxative (Milk of Magnesia or Miralax) should be taken according to package instructions if there has been no bowel movement after 48 hours.  You will likely have Dermabond (topical glue) over your incisions.  This seals the incisions and allows you to bathe and shower at any time after your surgery.  Glue should remain in place for up to 10 days.  It may be removed after 10 days by pealing off the Dermabond material or using Vaseline or naval jelly to remove.  If you have steri-strips over your incisions, you may remove the gauze bandage on the second day after surgery, and you may shower at that time.  Leave your steri-strips (small skin tapes) in place directly over the incision.  These strips should remain on the  skin for 5-7 days and then be removed.  You may get them wet in the shower and pat them dry.  Any sutures or staples will be removed at the office during your follow-up visit.  ACTIVITIES:  You may resume regular (light) daily activities beginning the next day - such as daily self-care, walking, climbing stairs - gradually increasing activities as tolerated.  You may have sexual intercourse when it is comfortable.  Refrain from any heavy lifting or straining until approved by your doctor.  You may drive when you are no longer taking prescription pain medication, when you can comfortably wear a seatbelt, and when you can safely maneuver your car and apply brakes.  You should see your doctor in the office for a follow-up appointment approximately 2-3 weeks after your surgery.  Make sure that you call for this appointment within a day or two after you arrive home to insure a convenient appointment time.  WHEN TO CALL YOUR DOCTOR: Fever over 101.0 Inability to urinate Continued bleeding from incision Increased pain, redness, or drainage from the incision Increasing abdominal pain  The clinic staff is available to answer your questions during regular business hours.  Please don't hesitate to call and ask to speak to one of the nurses for clinical concerns.  If you have a medical emergency, go to the nearest emergency room or call 911.  A surgeon from [facility] is always on call for the hospital.  Elainna Eshleman, MD [facility] Office: [phone] Toll Free:    [phone] FAX [phone]  Website: [email or web address] 

## 2020-10-14 LAB — SURGICAL PATHOLOGY

## 2020-10-15 ENCOUNTER — Encounter (HOSPITAL_COMMUNITY): Payer: Self-pay | Admitting: Surgery

## 2020-10-15 NOTE — OR Nursing (Signed)
Chart corrected on 10/15/2020. Omni used 10 mL.  Elna Breslow, RN

## 2021-01-20 ENCOUNTER — Other Ambulatory Visit: Payer: Self-pay

## 2021-01-20 ENCOUNTER — Other Ambulatory Visit: Payer: Self-pay | Admitting: Family Medicine

## 2021-01-20 ENCOUNTER — Ambulatory Visit
Admission: RE | Admit: 2021-01-20 | Discharge: 2021-01-20 | Disposition: A | Discharge status: Home / Self Care | Payer: BC Managed Care – PPO | Source: Ambulatory Visit | Attending: Family Medicine | Admitting: Family Medicine

## 2021-01-20 DIAGNOSIS — R079 Chest pain, unspecified: Secondary | ICD-10-CM | POA: Diagnosis not present

## 2021-01-20 DIAGNOSIS — R52 Pain, unspecified: Secondary | ICD-10-CM

## 2021-01-20 DIAGNOSIS — M25511 Pain in right shoulder: Secondary | ICD-10-CM | POA: Diagnosis not present

## 2021-07-28 DIAGNOSIS — M25551 Pain in right hip: Secondary | ICD-10-CM | POA: Diagnosis not present

## 2021-08-07 DIAGNOSIS — M25551 Pain in right hip: Secondary | ICD-10-CM | POA: Diagnosis not present

## 2021-08-22 DIAGNOSIS — M25551 Pain in right hip: Secondary | ICD-10-CM | POA: Diagnosis not present

## 2021-12-01 DIAGNOSIS — H35363 Drusen (degenerative) of macula, bilateral: Secondary | ICD-10-CM | POA: Diagnosis not present

## 2021-12-01 DIAGNOSIS — H527 Unspecified disorder of refraction: Secondary | ICD-10-CM | POA: Diagnosis not present

## 2021-12-01 DIAGNOSIS — H02831 Dermatochalasis of right upper eyelid: Secondary | ICD-10-CM | POA: Diagnosis not present

## 2021-12-01 DIAGNOSIS — H02834 Dermatochalasis of left upper eyelid: Secondary | ICD-10-CM | POA: Diagnosis not present

## 2021-12-01 DIAGNOSIS — H25813 Combined forms of age-related cataract, bilateral: Secondary | ICD-10-CM | POA: Diagnosis not present

## 2022-06-27 IMAGING — RF DG CHOLANGIOGRAM OPERATIVE
1 series · 4 of 4 positions shown · non-contrast
Comparison: 08/07/2020

CLINICAL DATA: 60-year-old male with history of cholelithiasis.

EXAM:
INTRAOPERATIVE CHOLANGIOGRAM
TECHNIQUE: Cholangiographic images from the C-arm fluoroscopic device were
submitted for interpretation post-operatively. Please see the
procedural report for the amount of contrast and the fluoroscopy
time utilized.

[Series 1: run · 4 of 120 frames shown]
[frame 19/120]
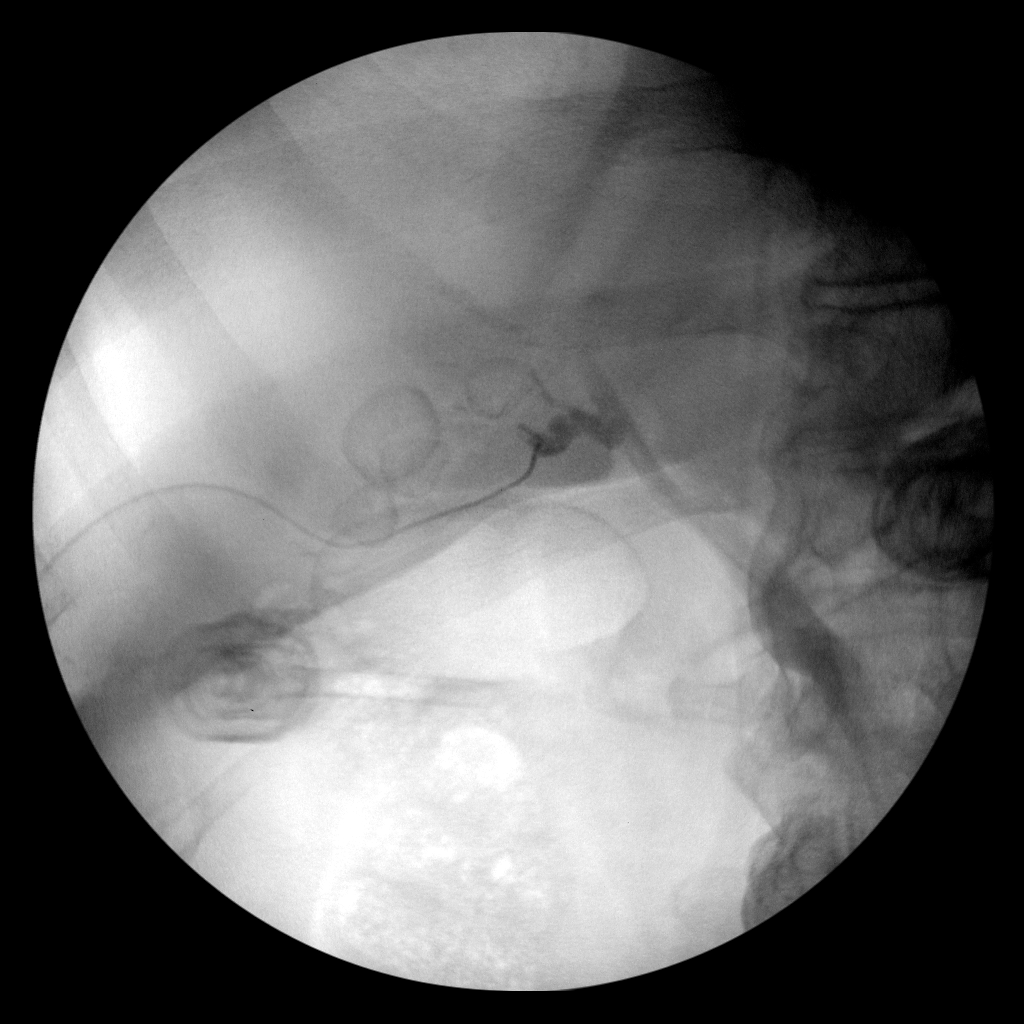
[frame 61/120]
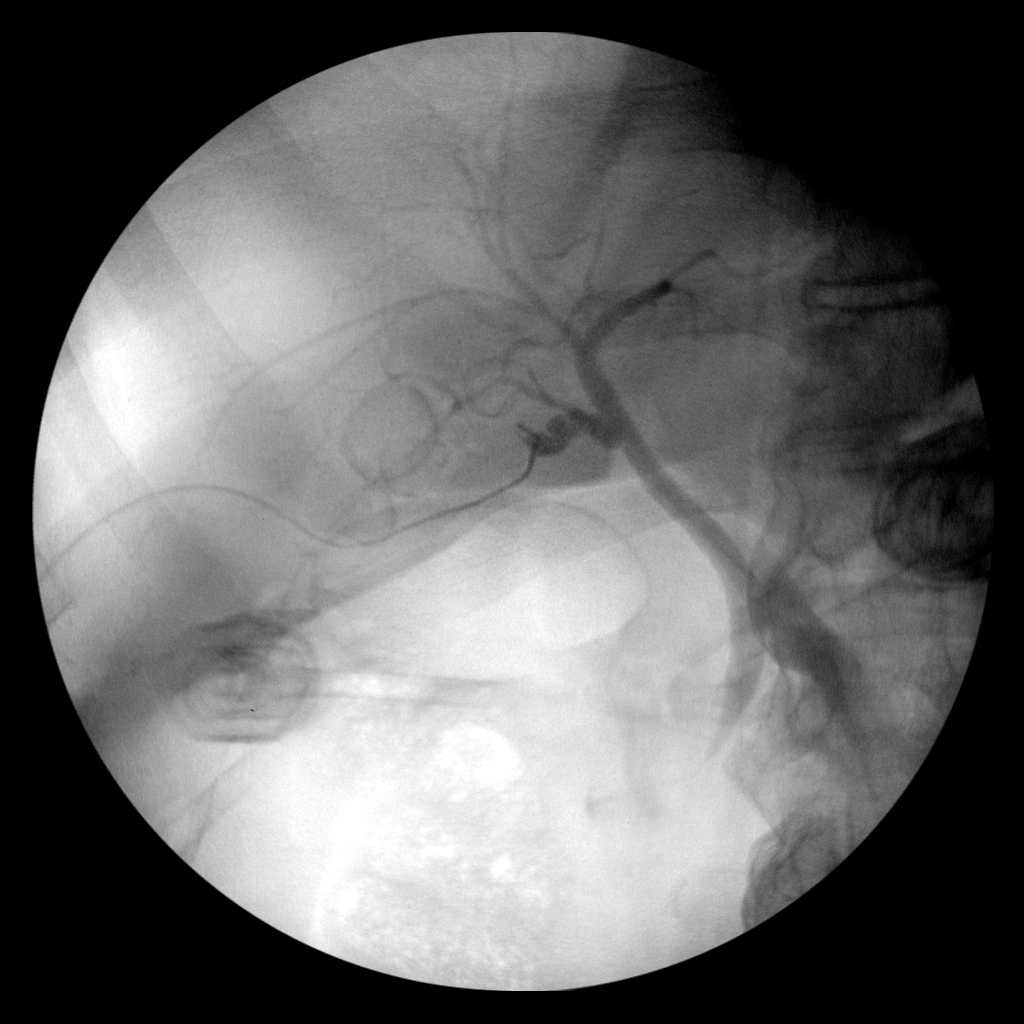
[frame 103/120]
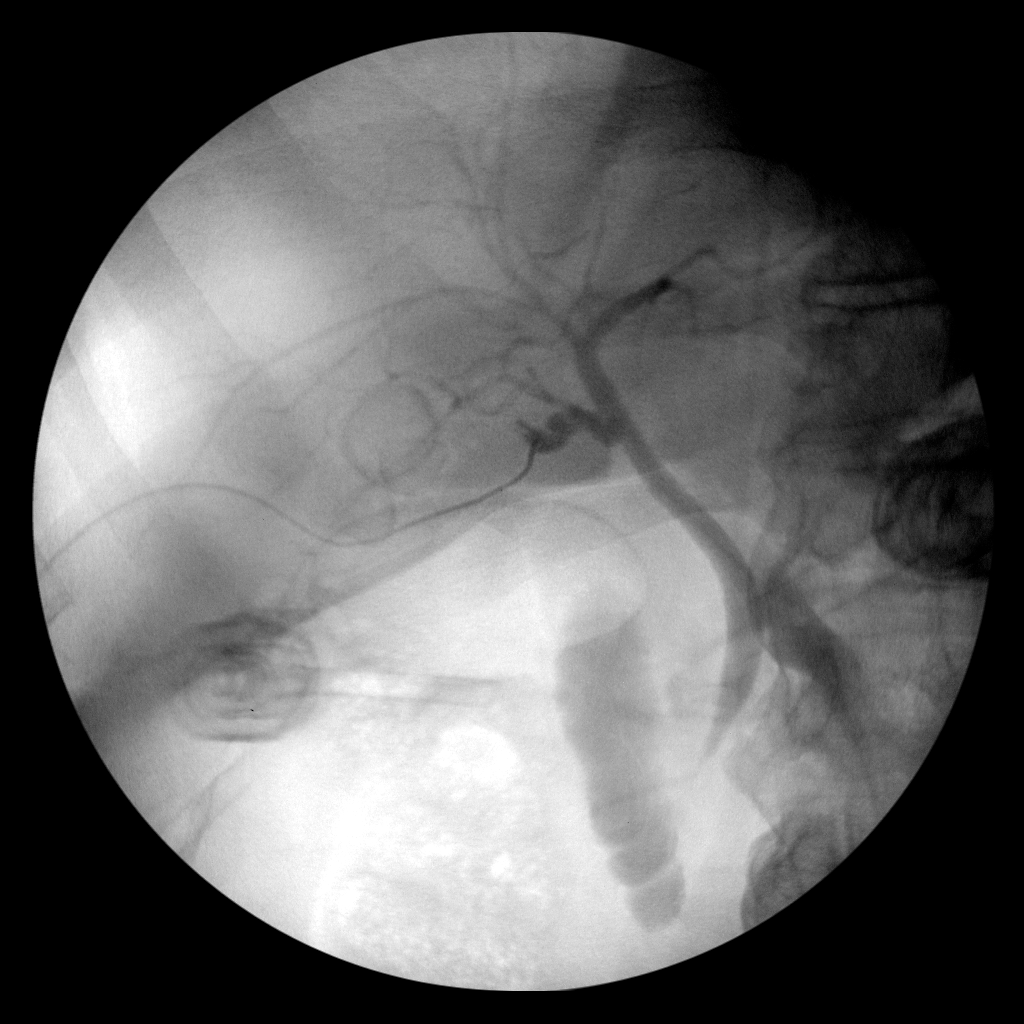
[frame 120/120]
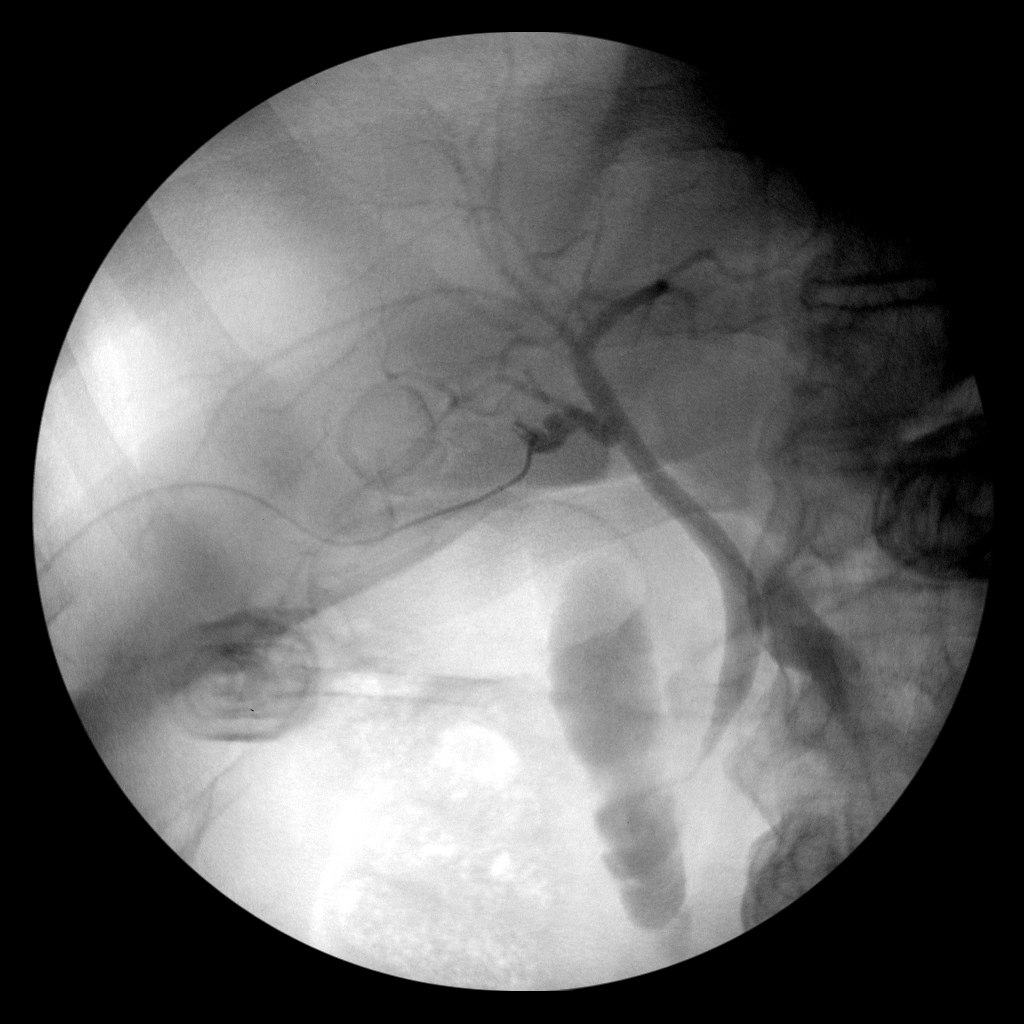

[4 of 4 positions shown; findings below may reference images not displayed]

FINDINGS: Intraoperative antegrade injection via the cystic duct which
opacifies the common bile duct and central portions of the
intrahepatic biliary tree. Contrast is visualized flowing freely
into the duodenum. There are no filling defects. No significant
intra or extrahepatic biliary ductal dilation. No apparent anomalous
anatomical configuration of the biliary tree.
IMPRESSION: No evidence of choledocholithiasis.

## 2023-12-20 DIAGNOSIS — E7800 Pure hypercholesterolemia, unspecified: Secondary | ICD-10-CM | POA: Insufficient documentation

## 2024-02-10 DIAGNOSIS — T368X5A Adverse effect of other systemic antibiotics, initial encounter: Secondary | ICD-10-CM | POA: Insufficient documentation

## 2024-03-16 DIAGNOSIS — Z933 Colostomy status: Secondary | ICD-10-CM | POA: Insufficient documentation

## 2024-04-21 ENCOUNTER — Telehealth: Payer: Self-pay

## 2024-04-21 NOTE — Telephone Encounter (Signed)
 I will typically make an exception and accept previous patients, but I do not see where I have seen him in the past.  Did he see me prior to us  going on to electronic health record?  When was he last seen by me?

## 2024-04-21 NOTE — Telephone Encounter (Signed)
 Copied from CRM #[id number]. Topic: Appointments - Scheduling Inquiry for Clinic >> Apr 21, 2024 12:22 PM Dedra B wrote: Reason for CRM: Pt was a previous patient of Dr. Levora. He has moved back to the area and would like to reestablish care with Dr. Levora. Pls let pt know if Dr. Levora would be willing to accept him as a new pt.

## 2024-04-24 ENCOUNTER — Telehealth: Payer: Self-pay | Admitting: Orthopedic Surgery
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
## 2024-04-25 NOTE — Telephone Encounter (Signed)
 Called the pt and advised, he verbalized understanding

## 2024-05-01 ENCOUNTER — Ambulatory Visit: Admitting: Student in an Organized Health Care Education/Training Program

## 2024-05-01 ENCOUNTER — Telehealth: Payer: Self-pay | Admitting: *Deleted

## 2024-05-01 ENCOUNTER — Encounter: Payer: Self-pay | Admitting: Student in an Organized Health Care Education/Training Program

## 2024-05-01 VITALS — BP 125/74 | HR 73 | Wt 281.0 lb

## 2024-05-01 DIAGNOSIS — Z131 Encounter for screening for diabetes mellitus: Secondary | ICD-10-CM | POA: Diagnosis not present

## 2024-05-01 DIAGNOSIS — E7800 Pure hypercholesterolemia, unspecified: Secondary | ICD-10-CM

## 2024-05-01 DIAGNOSIS — T8189XD Other complications of procedures, not elsewhere classified, subsequent encounter: Secondary | ICD-10-CM | POA: Diagnosis not present

## 2024-05-01 DIAGNOSIS — Z933 Colostomy status: Secondary | ICD-10-CM | POA: Diagnosis not present

## 2024-05-01 DIAGNOSIS — S32811G Multiple fractures of pelvis with unstable disruption of pelvic ring, subsequent encounter for fracture with delayed healing: Secondary | ICD-10-CM | POA: Diagnosis not present

## 2024-05-01 DIAGNOSIS — T8189XA Other complications of procedures, not elsewhere classified, initial encounter: Secondary | ICD-10-CM | POA: Insufficient documentation

## 2024-05-01 DIAGNOSIS — S22069D Unspecified fracture of T7-T8 vertebra, subsequent encounter for fracture with routine healing: Secondary | ICD-10-CM

## 2024-05-01 DIAGNOSIS — S22009A Unspecified fracture of unspecified thoracic vertebra, initial encounter for closed fracture: Secondary | ICD-10-CM | POA: Insufficient documentation

## 2024-05-01 DIAGNOSIS — S329XXA Fracture of unspecified parts of lumbosacral spine and pelvis, initial encounter for closed fracture: Secondary | ICD-10-CM | POA: Insufficient documentation

## 2024-05-01 DIAGNOSIS — E66813 Obesity, class 3: Secondary | ICD-10-CM | POA: Diagnosis not present

## 2024-05-01 LAB — CBC WITH DIFFERENTIAL/PLATELET
Basophils Absolute: 0.1 K/uL (ref 0.0–0.1)
Basophils Relative: 0.8 % (ref 0.0–3.0)
Eosinophils Absolute: 0.2 K/uL (ref 0.0–0.7)
Eosinophils Relative: 1.8 % (ref 0.0–5.0)
HCT: 38.3 % — ABNORMAL LOW (ref 39.0–52.0)
Hemoglobin: 12.6 g/dL — ABNORMAL LOW (ref 13.0–17.0)
Lymphocytes Relative: 23.3 % (ref 12.0–46.0)
Lymphs Abs: 2 K/uL (ref 0.7–4.0)
MCHC: 32.9 g/dL (ref 30.0–36.0)
MCV: 83.3 fl (ref 78.0–100.0)
Monocytes Absolute: 0.8 K/uL (ref 0.1–1.0)
Monocytes Relative: 8.6 % (ref 3.0–12.0)
Neutro Abs: 5.7 K/uL (ref 1.4–7.7)
Neutrophils Relative %: 65.5 % (ref 43.0–77.0)
Platelets: 287 K/uL (ref 150.0–400.0)
RBC: 4.6 Mil/uL (ref 4.22–5.81)
RDW: 17.7 % — ABNORMAL HIGH (ref 11.5–15.5)
WBC: 8.7 K/uL (ref 4.0–10.5)

## 2024-05-01 LAB — LIPID PANEL
Cholesterol: 145 mg/dL (ref 28–200)
HDL: 36.5 mg/dL — ABNORMAL LOW
LDL Cholesterol: 49 mg/dL (ref 10–99)
NonHDL: 108.06
Total CHOL/HDL Ratio: 4
Triglycerides: 296 mg/dL — ABNORMAL HIGH (ref 10.0–149.0)
VLDL: 59.2 mg/dL — ABNORMAL HIGH (ref 0.0–40.0)

## 2024-05-01 LAB — COMPREHENSIVE METABOLIC PANEL WITH GFR
ALT: 25 U/L (ref 3–53)
AST: 18 U/L (ref 5–37)
Albumin: 4.5 g/dL (ref 3.5–5.2)
Alkaline Phosphatase: 113 U/L (ref 39–117)
BUN: 15 mg/dL (ref 6–23)
CO2: 30 meq/L (ref 19–32)
Calcium: 9.8 mg/dL (ref 8.4–10.5)
Chloride: 101 meq/L (ref 96–112)
Creatinine, Ser: 0.71 mg/dL (ref 0.40–1.50)
GFR: 96.02 mL/min
Glucose, Bld: 94 mg/dL (ref 70–99)
Potassium: 4.9 meq/L (ref 3.5–5.1)
Sodium: 139 meq/L (ref 135–145)
Total Bilirubin: 0.4 mg/dL (ref 0.2–1.2)
Total Protein: 7.3 g/dL (ref 6.0–8.3)

## 2024-05-01 LAB — C-REACTIVE PROTEIN: CRP: <0.5 mg/dL — ABNORMAL LOW (ref 1.0–20.0)

## 2024-05-01 LAB — SEDIMENTATION RATE: Sed Rate: 23 mm/h — ABNORMAL HIGH (ref 0–20)

## 2024-05-01 LAB — HEMOGLOBIN A1C: Hgb A1c MFr Bld: 5.9 % (ref 4.6–6.5)
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
## 2024-05-01 NOTE — Assessment & Plan Note (Signed)
 Thoracic vertebral fractures have been repaired with rods and screws, with no complications reported. He is scheduled for an orthopedic consultation on December 31st for further evaluation but seems to be healing well.  I placed referral for physical therapy to continue recovering his functional status.

## 2024-05-01 NOTE — Telephone Encounter (Signed)
 Received referral for colostomy reversal. Discussed case with general surgeon on call, Dr. Evonnie.   Recommends that pelvic injury/ wounds and infection should be cleared prior to any future surgeries.   Also advised that patient requires colonoscopy prior to reversal to determine length of distal colon remaining and to check for colon cancer.   Call placed to patient and patient made aware.   Advised that once these are completed, plan can be made if patient will require tertiary care or if reversal can proceed at [facility].  CSix, LPN/ RSA
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
## 2024-05-01 NOTE — Assessment & Plan Note (Signed)
 Probably the most acute issue is the surgical site infection of the pelvis resulting in prolonged antibiotic use.  Still on doxycycline and ciprofloxacin.  Will check inflammatory markers and white count today.  On exam the left inguinal wound is the largest about 3 cm, but both wounds had purulence on the bandage.  No systemic symptoms.  He is to continue antibiotics for about another 2 weeks.  Will refer to wound clinic in [place].
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
## 2024-05-01 NOTE — Assessment & Plan Note (Signed)
 A colostomy remains in place following sigmoid colon resection. The surgeon recommends waiting for complete pelvic wound healing before considering reversal. He is referred to general surgery to evaluate the timing for colostomy reversal.

## 2024-05-02 ENCOUNTER — Ambulatory Visit: Payer: Self-pay | Admitting: Student in an Organized Health Care Education/Training Program
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
## 2024-05-03 ENCOUNTER — Other Ambulatory Visit: Payer: Self-pay

## 2024-05-03 ENCOUNTER — Encounter (HOSPITAL_COMMUNITY): Payer: Self-pay

## 2024-05-03 ENCOUNTER — Ambulatory Visit (HOSPITAL_COMMUNITY): Admitting: Physical Therapy

## 2024-05-03 ENCOUNTER — Encounter: Payer: Self-pay | Admitting: Orthopedic Surgery

## 2024-05-03 ENCOUNTER — Ambulatory Visit: Admitting: Orthopedic Surgery

## 2024-05-03 DIAGNOSIS — R262 Difficulty in walking, not elsewhere classified: Secondary | ICD-10-CM | POA: Insufficient documentation

## 2024-05-03 DIAGNOSIS — M6281 Muscle weakness (generalized): Secondary | ICD-10-CM | POA: Diagnosis present

## 2024-05-03 DIAGNOSIS — T81321D Disruption or dehiscence of closure of internal operation (surgical) wound of abdominal wall muscle or fascia, subsequent encounter: Secondary | ICD-10-CM | POA: Insufficient documentation

## 2024-05-03 DIAGNOSIS — S329XXA Fracture of unspecified parts of lumbosacral spine and pelvis, initial encounter for closed fracture: Secondary | ICD-10-CM

## 2024-05-03 DIAGNOSIS — M25651 Stiffness of right hip, not elsewhere classified: Secondary | ICD-10-CM | POA: Insufficient documentation

## 2024-05-03 DIAGNOSIS — M25551 Pain in right hip: Secondary | ICD-10-CM | POA: Diagnosis present
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
## 2024-05-03 NOTE — Patient Instructions (Signed)
 Continue with wound care  Complete the antibiotics that have been prescribed for you  Continue to work with physical therapy  Follow-up in 2 months.  If you have any issues regarding pain, symptoms consistent with an infection or wound care issues, please contact the office for an earlier appointment.

## 2024-05-05 ENCOUNTER — Encounter (HOSPITAL_COMMUNITY): Payer: Self-pay

## 2024-05-05 ENCOUNTER — Telehealth: Payer: Self-pay | Admitting: Student in an Organized Health Care Education/Training Program

## 2024-05-05 ENCOUNTER — Ambulatory Visit (HOSPITAL_COMMUNITY): Attending: Student in an Organized Health Care Education/Training Program

## 2024-05-05 DIAGNOSIS — M6281 Muscle weakness (generalized): Secondary | ICD-10-CM | POA: Insufficient documentation

## 2024-05-05 DIAGNOSIS — T81321D Disruption or dehiscence of closure of internal operation (surgical) wound of abdominal wall muscle or fascia, subsequent encounter: Secondary | ICD-10-CM | POA: Diagnosis present

## 2024-05-05 DIAGNOSIS — M25551 Pain in right hip: Secondary | ICD-10-CM | POA: Insufficient documentation

## 2024-05-05 DIAGNOSIS — M25651 Stiffness of right hip, not elsewhere classified: Secondary | ICD-10-CM | POA: Diagnosis present

## 2024-05-05 DIAGNOSIS — R262 Difficulty in walking, not elsewhere classified: Secondary | ICD-10-CM | POA: Diagnosis present

## 2024-05-05 NOTE — Therapy (Signed)
 " OUTPATIENT PHYSICAL THERAPY LOWER EXTREMITY Connor Wang   Patient Name: Connor Wang MRN: [id number] DOB:[birth year], 64 y.o., male Today's Date: 05/05/2024  END OF SESSION:  PT End of Session - 05/05/24 1442     Visit Number 2    Number of Visits 16    Date for Recertification  06/28/24    Authorization Type medicare/[employer]    Progress Note Due on Visit 10    PT Start Time 1444    PT Stop Time 1541    PT Time Calculation (min) 57 min    Activity Tolerance Patient tolerated treatment well    Behavior During Therapy [facility] for tasks assessed/performed          Past Medical History:  Diagnosis Date   History of cholecystectomy 10/11/2020   Vancomycin adverse reaction 02/10/2024   Past Surgical History:  Procedure Laterality Date   CHOLECYSTECTOMY N/A 10/11/2020   Procedure: LAPAROSCOPIC CHOLECYSTECTOMY WITH INTRAOPERATIVE CHOLANGIOGRAM;  Surgeon: Eletha Boas, MD;  Location: [facility];  Service: General;  Laterality: N/A;   COLONOSCOPY     Patient Active Problem List   Diagnosis Date Noted   Closed pelvic fracture (HCC) 05/01/2024   Obesity, Class III, BMI 40-49.9 (HCC) 05/01/2024   Nonhealing surgical wound 05/01/2024   Thoracic vertebral fracture (HCC) 05/01/2024   Colostomy in place [facility]) 03/16/2024   Hypercholesterolemia 12/20/2023    PCP: Jerrell Cleatus Ned, MD  REFERRING PROVIDER: Jerrell Cleatus Ned, MD  REFERRING DIAG: [phone] (ICD-10-CM) - Multiple closed fractures of pelvis with unstable disruption of pelvic ring with delayed healing, subsequent encounter Non healing surgical site wounds. REF110 - AMB REFERRAL TO WOUND CLINIC per MD  THERAPY DIAG:  S32.811G (ICD-10-CM) - Multiple closed fractures of pelvis with unstable disruption of pelvic ring with delayed healing, subsequent encounter Difficulty walking Muscle weakness  Rationale for Evaluation and Treatment: Rehabilitation HX: Connor Wang is a 64 y.o. male who is Jehovah's  witness who presented on 12/04/23 s/p a crush injury. He was found to have Left 7,8 and right rib fractures, spine fractures requiring PSF, possible bladder wall injury (cysto without leak) as well as a Traumatic sigmoid colon transection with discontinuous sigmoid colon, mesenteric contusions, R lumbar hernia, Traumatic right posterior abdominal wall defect. He required OR for exploratory laparotomy, sigmoidoscopy, sigmoidectomy small bowel resection colostomy creation 8/2. He was also found to have multiple pelvic fractures which required OR for ex fix then later ORIF and perc screw. His post-op course was complicated by a pelvic wound infection that required debridement and wound vac placement. ID was consulted and he was discharged with IV antibiotics.   PT went to In patient rehab until 9/16 when readmitted due to surgical site infection.  Discharged on 01/26/24 back to IP rehag.      Currently living by himself and independent, back to driving, ambulates with the help of a rolling walker or just a cane at times. He has consultation with orthopedics in 2 days. ONSET DATE: 12/04/23 Hospitalized 12/04/23-12/30/23 In pt rehab:  12/30/23-01/18/24 RE hospitalized on 01/18/24-01/26/24 due to infection from wound site Taken to OR on 9/17 and grew coryne, E Cloacae, Staph Epi. Placed on vancomycin and po cipro and flagyl with plan for 6 weeks with planned end date 10/30  In patient rehab from 9/24-10/30/25 Home with daughter on 03/02/24 Currently living on his own and driving    SUBJECTIVE STATEMENT: Patient states that he was pinned between a 4x4 and a [employer] truck.  Pt recently went to  ortho who states he can be FWB.  Pt is using a rollator to walk with this time.  Goes up steps with his cane one at a time: Can sit:  One hour Walk: With rollator 5 minute  Stand: no problem PERTINENT HISTORY: CXR - no acute findings CT T/L - T8 VB fx, R ileum fx extending into sup R sacral ala CT C/A/P - L 7-8, R 11th  rib fxs, traumatic colon transection with discontinuous sigmoid colon, small volume pneumoperitoneum, mesenteric hematoma, R>L retroperitoneal hematoma, extensive bilat pelvis fracture, displaced bladder traumatic R lumbar hernia containing RP fat and hemorrhage Pelvis xray- displaced obturator ring fxs, R ilaiac fx RLE xray- no femur fx      PAIN: 3/10 Are you having pain? Yes: Pain location: Rt hip  Pain description: sore Aggravating factors: sitting  Relieving factors: rest  PRECAUTIONS: Other: cellulitis   RED FLAGS: None   WEIGHT BEARING RESTRICTIONS: Yes PWB RT  FALLS:  Has patient fallen in last 6 months? No  LIVING ENVIRONMENT: Lives with: lives with their family and lives alone Lives in: House/apartment Stairs: Yes: Internal: 6 steps; on right going up and External: 3 steps; on right going up Has following equipment at home: Single point cane and Walker - 4 wheeled  OCCUPATION: retired   PLOF: Independent  PATIENT GOALS: Wounds to heal as well as to be able to walk without assistive device   NEXT MD VISIT: trying to establish  OBJECTIVE:  Note: Objective measures were completed at Evaluation unless otherwise noted.  DIAGNOSTIC FINDINGS: Per Ortho on 02/08/24:  X-rays taken here today independently reviewed by me, AP of the pelvis with inlet and outlet projections show maintenance of alignment and no failure of fixation compared to the most recent postoperative films.  Fracture line is still evident particularly up at the level of the iliac crest where the bulk of the displacement is.  Overall the alignment of the right hemipelvis is good and is unchanged.   Discussed with the patient I do not feel that there is adequate healing to permit full weightbearing particularly since he still having a fair amount of pain when there is direct pressure on the right side that likely is indicating that there is insufficient healing there for weightbearing as well. Pt returned  to MD on 04/18/24 now FWB COGNITION: Overall cognitive status: Within functional limits for tasks assessed     SENSATION: Not tested  POSTURE: No Significant postural limitations  PALPATION: Noted scar tissue located at all wound sites; pt states scar tissue on his back as well from surgery.   LOWER EXTREMITY ROM: not formally tested   Active ROM Right eval Left eval  Hip flexion    Hip extension    Hip abduction    Hip adduction    Hip internal rotation    Hip external rotation    Knee flexion    Knee extension    Ankle dorsiflexion    Ankle plantarflexion    Ankle inversion    Ankle eversion     (Blank rows = not tested)  LOWER EXTREMITY MMT:  MMT Right eval Left eval  Hip flexion 2-   Hip extension    Hip abduction 2   Hip adduction    Hip internal rotation    Hip external rotation    Knee flexion    Knee extension 5 5  Ankle dorsiflexion 4 5  Ankle plantarflexion    Ankle inversion    Ankle eversion     (  Blank rows = not tested)   FUNCTIONAL TESTS:  30 seconds chair stand test: uses UE B:  6x 2 minute walk test: 236 ft with rollator    05/03/24 0001  Subjective Assessment  Subjective see above  Patient and Family Stated Goals Wounds to heal and to walk better.  Date of Onset 12/04/23  Prior Treatments Acute, IP rehab and HH  Pain Assessment  Pain Scale  (see above)  Evaluation and Treatment  Evaluation and Treatment Procedures Explained to Patient/Family Yes  Evaluation and Treatment Procedures agreed to  Wound 05/03/24 0835 Surgical Closed Surgical Incision Abdomen Left;Lower  Date First Assessed/Time First Assessed: 05/03/24 0835   Present on Original Admission: Yes  Primary Wound Type: Surgical  Secondary Wound Type - Surgical: Closed Surgical Incision  Location: Abdomen  Location Orientation: Left;Lower  Wound Image   Site / Wound Assessment Red  Wound Length (cm)  (wound is .9 x 3.0 cm wound is hypergranulated.)  Drainage Description  Serous  Drainage Amount Moderate  Dressing Type Gauze (Comment)  Dressing Changed Changed  Dressing Status Old drainage  Margins Attached edges (approximated)  Treatment Cleansed;Other (Comment) (compression to decrease hypergranulation)  Wound 05/03/24 0845 Abdomen Lower;Medial;Right  Date First Assessed/Time First Assessed: 05/03/24 0845   Present on Original Admission: Yes  Location: Abdomen  Location Orientation: Lower;Medial;Right  Wound Image  (wound is .7 x 1.0 cm slight hypergranulation dressed the same as Left.)  Wound Therapy - Assess/Plan/Recommendations  Wound Therapy - Clinical Statement see below  Wound Therapy - Functional Problem List see below  Factors Delaying/Impairing Wound Healing Altered sensation;Infection - systemic/local  Hydrotherapy Plan Dressing change;Patient/family education  Wound Therapy - Frequency 2X / week  Wound Therapy - Current Recommendations PT  Wound Plan silverhydrofiber, foam with medipore tape to attempt to decrease hypergranulation.  Wound Therapy  Dressing  silverhydrofiber, 4x4, foam and medipore tape.                                                                                                                                  TREATMENT DATE: 05/03/24: Evaluation Manual to scar tissue around wounds  2 minute walk with rollator: pt out of breath 6 sit to stand using UE.   Supine: 5 bridges 5 hip abductions 3 heel slides  Lt Side lying  Rt hip abduction  x 5   PATIENT EDUCATION:  Education details: HEP (pt also has a HEP for [facility] therapist which is extensive. Standing hamstring curls, squats, hip abduction,  Person educated: Patient Education method: Explanation, Verbal cues, and Handouts Education comprehension: verbalized understanding  HOME EXERCISE PROGRAM: Access Code: [email or web address] URL: [email or web address] Date: 05/03/2024 Prepared by: Montie Metro  Exercises - Supine Bridge  - 2 x daily - 7 x weekly  - 1 sets - 5-20 reps - 5 hold - Beginner Side Leg Lift  - 2 x daily - 7 x weekly - 1 sets - 10 reps -  5 hold - Supine Heel Slide  - 2 x daily - 7 x weekly - 1 sets - 20 reps - 5 hold  ASSESSMENT:  CLINICAL IMPRESSION: Patient is a 64 y.o. male who was seen today for physical therapy evaluation and treatment for wound and trauma evaluation.  Pt exhibits two non healing hyper-granulated wounds Rt inguinal and Lt inguinal area.  He is unable to raise his Rt leg into the bed without assist from the left.  He has had these wounds since 12/04/23.  He has decreased activity tolerance, decreased strength, increased pain, decreased balance and decreased ROM.  Mr. Earll will benefit from skilled PT to address these issues and maximize his functional ability.    OBJECTIVE IMPAIRMENTS: Abnormal gait, cardiopulmonary status limiting activity, decreased activity tolerance, decreased balance, decreased mobility, difficulty walking, decreased ROM, decreased strength, pain, and decreased skin integrity.   ACTIVITY LIMITATIONS: carrying, lifting, sitting, stairs, transfers, dressing, reach over head, hygiene/grooming, and locomotion level  PARTICIPATION LIMITATIONS: meal prep, cleaning, shopping, and community activity  PERSONAL FACTORS: Time since onset of injury/illness/exacerbation are also affecting patient's functional outcome.   REHAB POTENTIAL: Good  CLINICAL DECISION MAKING: Stable/uncomplicated  EVALUATION COMPLEXITY: Moderate   GOALS: Goals reviewed with patient? Yes  SHORT TERM GOALS: Target date: 05/31/24 Rt wound to be healed  Baseline: Goal status: INITIAL  2.  Pain in sacral area to be no greater than a 2/10 Baseline:  Goal status: INITIAL  3.  Pt to be able to walk with a rollator for 20 minutes for short shopping trips  Baseline:  Goal status: INITIAL  4.  PT to tolerate sitting for an hour and a half for traveling  Baseline:  Goal status: INITIAL  5.  Mm grades to be  increased by 1 grade to allow the above to occur.  Baseline:  Goal status: INITIAL  6.  Pt to be able to walk for 280 ft in a two minute period.  Baseline:  Goal status: INITIAL  LONG TERM GOALS: Target date: 06/28/24  PT to have no pain in his Rt hip  Baseline:  Goal status: INITIAL  2.  PT to be able to walk for 30 minutes with a cane Baseline:  Goal status: INITIAL  3.  Pt to be able to walk 359ft with a cane in 2 minute period of time.  Baseline:  Goal status: INITIAL  4.  Pt to be able to come sit to stand without using UE assist.  Baseline:  Goal status: INITIAL  5.  PT to be able to complete 10 sit to stand in 30 seconds  Baseline:  Goal status: INITIAL  6.  PT to be able to go up and down steps in a reciprocal manner with one rail Baseline:  Goal status: INITIAL   PLAN:  PT FREQUENCY: 2x/week  ortho referral stated 3x pt request 2x as he is completing HEP at home.   PT DURATION: 8 weeks  PLANNED INTERVENTIONS: 97110-Therapeutic exercises, 97530- Therapeutic activity, 97112- Neuromuscular re-education, 97535- Self Care, 02859- Manual therapy, [phone]- Gait training, [phone]- Wound care (first 20 sq cm), and Patient/Family education  PLAN FOR NEXT SESSION: begin abdominal set with clam, side stepping, slow marching, 4 step up with UE assist.   Montie Metro, PT CLT [phone]  05/05/2024, 4:56 PM  OUTPATIENT PHYSICAL THERAPY LOWER EXTREMITY Connor Wang   Patient Name: Connor Wang MRN: [id number] DOB:24-Jan-1961, 64 y.o., male Today's Date: 05/05/2024  END OF SESSION:  PT End of Session -  05/05/24 1442     Visit Number 2    Number of Visits 16    Date for Recertification  06/28/24    Authorization Type medicare/AARP    Progress Note Due on Visit 10    PT Start Time 1444    PT Stop Time 1541    PT Time Calculation (min) 57 min    Activity Tolerance Patient tolerated treatment well    Behavior During Therapy [facility] for tasks assessed/performed           Past Medical History:  Diagnosis Date   History of cholecystectomy 10/11/2020   Vancomycin adverse reaction 02/10/2024   Past Surgical History:  Procedure Laterality Date   CHOLECYSTECTOMY N/A 10/11/2020   Procedure: LAPAROSCOPIC CHOLECYSTECTOMY WITH INTRAOPERATIVE CHOLANGIOGRAM;  Surgeon: Eletha Boas, MD;  Location: [facility];  Service: General;  Laterality: N/A;   COLONOSCOPY     Patient Active Problem List   Diagnosis Date Noted   Closed pelvic fracture (HCC) 05/01/2024   Obesity, Class III, BMI 40-49.9 (HCC) 05/01/2024   Nonhealing surgical wound 05/01/2024   Thoracic vertebral fracture (HCC) 05/01/2024   Colostomy in place [facility]) 03/16/2024   Hypercholesterolemia 12/20/2023    PCP: Jerrell Cleatus Ned, MD  REFERRING PROVIDER: Jerrell Cleatus Ned, MD  REFERRING DIAG: [phone] (ICD-10-CM) - Multiple closed fractures of pelvis with unstable disruption of pelvic ring with delayed healing, subsequent encounter Non healing surgical site wounds. REF110 - AMB REFERRAL TO WOUND CLINIC per MD  THERAPY DIAG:  S32.811G (ICD-10-CM) - Multiple closed fractures of pelvis with unstable disruption of pelvic ring with delayed healing, subsequent encounter Difficulty walking Muscle weakness  Rationale for Evaluation and Treatment: Rehabilitation HX: Christepher Melchior is a 64 y.o. male who is Jehovah's witness who presented on 12/04/23 s/p a crush injury. He was found to have Left 7,8 and right rib fractures, spine fractures requiring PSF, possible bladder wall injury (cysto without leak) as well as a Traumatic sigmoid colon transection with discontinuous sigmoid colon, mesenteric contusions, R lumbar hernia, Traumatic right posterior abdominal wall defect. He required OR for exploratory laparotomy, sigmoidoscopy, sigmoidectomy small bowel resection colostomy creation 8/2. He was also found to have multiple pelvic fractures which required OR for ex fix then later ORIF and perc  screw. His post-op course was complicated by a pelvic wound infection that required debridement and wound vac placement. ID was consulted and he was discharged with IV antibiotics.   PT went to In patient rehab until 9/16 when readmitted due to surgical site infection.  Discharged on 01/26/24 back to IP rehag.      Currently living by himself and independent, back to driving, ambulates with the help of a rolling walker or just a cane at times. He has consultation with orthopedics in 2 days. ONSET DATE: 12/04/23 Hospitalized 12/04/23-12/30/23 In pt rehab:  12/30/23-01/18/24 RE hospitalized on 01/18/24-01/26/24 due to infection from wound site Taken to OR on 9/17 and grew coryne, E Cloacae, Staph Epi. Placed on vancomycin and po cipro and flagyl with plan for 6 weeks with planned end date 10/30  In patient rehab from 9/24-10/30/25 Home with daughter on 03/02/24 Currently living on his own and driving    SUBJECTIVE STATEMENT: Patient states that he was pinned between a 4x4 and a [employer] truck.  Pt recently went to ortho who states he can be FWB.  Pt is using a rollator to walk with this time.  Goes up steps with his cane one at a time: Can sit:  One hour Walk: With rollator 5 minute  Stand: no problem PERTINENT HISTORY: CXR - no acute findings CT T/L - T8 VB fx, R ileum fx extending into sup R sacral ala CT C/A/P - L 7-8, R 11th rib fxs, traumatic colon transection with discontinuous sigmoid colon, small volume pneumoperitoneum, mesenteric hematoma, R>L retroperitoneal hematoma, extensive bilat pelvis fracture, displaced bladder traumatic R lumbar hernia containing RP fat and hemorrhage Pelvis xray- displaced obturator ring fxs, R ilaiac fx RLE xray- no femur fx      PAIN: 3/10 Are you having pain? Yes: Pain location: Rt hip  Pain description: sore Aggravating factors: sitting  Relieving factors: rest  PRECAUTIONS: Other: cellulitis   RED FLAGS: None   WEIGHT BEARING RESTRICTIONS: Yes  PWB RT  FALLS:  Has patient fallen in last 6 months? No  LIVING ENVIRONMENT: Lives with: lives with their family and lives alone Lives in: House/apartment Stairs: Yes: Internal: 6 steps; on right going up and External: 3 steps; on right going up Has following equipment at home: Single point cane and Walker - 4 wheeled  OCCUPATION: retired   PLOF: Independent  PATIENT GOALS: Wounds to heal as well as to be able to walk without assistive device   NEXT MD VISIT: trying to establish  OBJECTIVE:  Note: Objective measures were completed at Evaluation unless otherwise noted.  DIAGNOSTIC FINDINGS: Per Ortho on 02/08/24:  X-rays taken here today independently reviewed by me, AP of the pelvis with inlet and outlet projections show maintenance of alignment and no failure of fixation compared to the most recent postoperative films.  Fracture line is still evident particularly up at the level of the iliac crest where the bulk of the displacement is.  Overall the alignment of the right hemipelvis is good and is unchanged.   Discussed with the patient I do not feel that there is adequate healing to permit full weightbearing particularly since he still having a fair amount of pain when there is direct pressure on the right side that likely is indicating that there is insufficient healing there for weightbearing as well. Pt returned to MD on 04/18/24 now FWB COGNITION: Overall cognitive status: Within functional limits for tasks assessed     SENSATION: Not tested  POSTURE: No Significant postural limitations  PALPATION: Noted scar tissue located at all wound sites; pt states scar tissue on his back as well from surgery.   LOWER EXTREMITY ROM: not formally tested   Active ROM Right eval Left eval  Hip flexion    Hip extension    Hip abduction    Hip adduction    Hip internal rotation    Hip external rotation    Knee flexion    Knee extension    Ankle dorsiflexion    Ankle  plantarflexion    Ankle inversion    Ankle eversion     (Blank rows = not tested)  LOWER EXTREMITY MMT:  MMT Right eval Left eval  Hip flexion 2-   Hip extension    Hip abduction 2   Hip adduction    Hip internal rotation    Hip external rotation    Knee flexion    Knee extension 5 5  Ankle dorsiflexion 4 5  Ankle plantarflexion    Ankle inversion    Ankle eversion     (Blank rows = not tested)   FUNCTIONAL TESTS:  30 seconds chair stand test: uses UE B:  6x 2 minute walk test: 236 ft with rollator  05/03/24 0001  Subjective Assessment  Subjective see above  Patient and Family Stated Goals Wounds to heal and to walk better.  Date of Onset 12/04/23  Prior Treatments Acute, IP rehab and HH  Pain Assessment  Pain Scale  (see above)  Evaluation and Treatment  Evaluation and Treatment Procedures Explained to Patient/Family Yes  Evaluation and Treatment Procedures agreed to  Wound 05/03/24 0835 Surgical Closed Surgical Incision Abdomen Left;Lower  Date First Assessed/Time First Assessed: 05/03/24 0835   Present on Original Admission: Yes  Primary Wound Type: Surgical  Secondary Wound Type - Surgical: Closed Surgical Incision  Location: Abdomen  Location Orientation: Left;Lower  Wound Image   Site / Wound Assessment Red  Wound Length (cm)  (wound is .9 x 3.0 cm wound is hypergranulated.)  Drainage Description Serous  Drainage Amount Moderate  Dressing Type Gauze (Comment)  Dressing Changed Changed  Dressing Status Old drainage  Margins Attached edges (approximated)  Treatment Cleansed;Other (Comment) (compression to decrease hypergranulation)  Wound 05/03/24 0845 Abdomen Lower;Medial;Right  Date First Assessed/Time First Assessed: 05/03/24 0845   Present on Original Admission: Yes  Location: Abdomen  Location Orientation: Lower;Medial;Right  Wound Image  (wound is .7 x 1.0 cm slight hypergranulation dressed the same as Left.)  Wound Therapy -  Assess/Plan/Recommendations  Wound Therapy - Clinical Statement see below  Wound Therapy - Functional Problem List see below  Factors Delaying/Impairing Wound Healing Altered sensation;Infection - systemic/local  Hydrotherapy Plan Dressing change;Patient/family education  Wound Therapy - Frequency 2X / week  Wound Therapy - Current Recommendations PT  Wound Plan silverhydrofiber, foam with medipore tape to attempt to decrease hypergranulation.  Wound Therapy  Dressing  silverhydrofiber, 4x4, foam and medipore tape.                                                                                                                                  TREATMENT DATE:  05/05/24: Wound care Supine: Bridge 10x Heel slides 10x PROM for extension 5x 20 Sidelying  TrA activation 10x 5 Rt LE clam 10x 5 STS from 21in height, cueing for mechanics and encouraged equal weight bearing, tendency to lean Lt directions   05/03/24: Evaluation Manual to scar tissue around wounds  2 minute walk with rollator: pt out of breath 6 sit to stand using UE.   Supine: 5 bridges 5 hip abductions 3 heel slides  Lt Side lying  Rt hip abduction  x 5   PATIENT EDUCATION:  Education details: HEP (pt also has a HEP for [facility] therapist which is extensive. Standing hamstring curls, squats, hip abduction,  Person educated: Patient Education method: Explanation, Verbal cues, and Handouts Education comprehension: verbalized understanding  HOME EXERCISE PROGRAM: Access Code: [email or web address] URL: [email or web address] Date: 05/03/2024 Prepared by: Montie Metro  Exercises - Supine Bridge  - 2 x daily - 7 x weekly - 1 sets - 5-20 reps - 5 hold - Beginner Side Leg Lift  - 2  x daily - 7 x weekly - 1 sets - 10 reps - 5 hold - Supine Heel Slide  - 2 x daily - 7 x weekly - 1 sets - 20 reps - 5 hold  05/05/24: - Clam  - 2 x daily - 7 x weekly - 2 sets - 10 reps - 5 hold - Sit to Stand with Arms Crossed   - 2 x daily - 7 x weekly - 1 sets - 10 reps  ASSESSMENT:  CLINICAL IMPRESSION: 05/05/24:  Began session with wound care, some reduction noted in hypergranulation.  Wounds cleansed well, no selective debridement necessary, continues with silverhydrofiber to address drainage and foam to assist with hypergranulation.  Therex focus on hip strengthening.  Pt with verbal and tactile cueing to improve abdominal bracing.  Added clams, STS and sidestepping for gluteal strengthening, required cueing to reduce compensation with clam, elevated height to 21in to complete STS without HHA and SBA for safety with balance activities.  Pt able to complete all exercises with no reports of increased pain.  Added to HEP with printout given and verbalized understanding.  Goals were reviewed, verbalized agreement with POC.  Eval:  Patient is a 64 y.o. male who was seen today for physical therapy evaluation and treatment for wound and trauma evaluation.  Pt exhibits two non healing hyper-granulated wounds Rt inguinal and Lt inguinal area.  He is unable to raise his Rt leg into the bed without assist from the left.  He has had these wounds since 12/04/23.  He has decreased activity tolerance, decreased strength, increased pain, decreased balance and decreased ROM.  Mr. Olivera will benefit from skilled PT to address these issues and maximize his functional ability.    OBJECTIVE IMPAIRMENTS: Abnormal gait, cardiopulmonary status limiting activity, decreased activity tolerance, decreased balance, decreased mobility, difficulty walking, decreased ROM, decreased strength, pain, and decreased skin integrity.   ACTIVITY LIMITATIONS: carrying, lifting, sitting, stairs, transfers, dressing, reach over head, hygiene/grooming, and locomotion level  PARTICIPATION LIMITATIONS: meal prep, cleaning, shopping, and community activity  PERSONAL FACTORS: Time since onset of injury/illness/exacerbation are also affecting patient's functional  outcome.   REHAB POTENTIAL: Good  CLINICAL DECISION MAKING: Stable/uncomplicated  EVALUATION COMPLEXITY: Moderate   GOALS: Goals reviewed with patient? Yes  SHORT TERM GOALS: Target date: 05/31/24 Rt wound to be healed  Baseline: Goal status: INITIAL  2.  Pain in sacral area to be no greater than a 2/10 Baseline:  Goal status: INITIAL  3.  Pt to be able to walk with a rollator for 20 minutes for short shopping trips  Baseline:  Goal status: INITIAL  4.  PT to tolerate sitting for an hour and a half for traveling  Baseline:  Goal status: INITIAL  5.  Mm grades to be increased by 1 grade to allow the above to occur.  Baseline:  Goal status: INITIAL  6.  Pt to be able to walk for 280 ft in a two minute period.  Baseline:  Goal status: INITIAL  LONG TERM GOALS: Target date: 06/28/24  PT to have no pain in his Rt hip  Baseline:  Goal status: INITIAL  2.  PT to be able to walk for 30 minutes with a cane Baseline:  Goal status: INITIAL  3.  Pt to be able to walk 397ft with a cane in 2 minute period of time.  Baseline:  Goal status: INITIAL  4.  Pt to be able to come sit to stand without using UE assist.  Baseline:  Goal status: INITIAL  5.  PT to be able to complete 10 sit to stand in 30 seconds  Baseline:  Goal status: INITIAL  6.  PT to be able to go up and down steps in a reciprocal manner with one rail Baseline:  Goal status: INITIAL   PLAN:  PT FREQUENCY: 2x/week  ortho referral stated 3x pt request 2x as he is completing HEP at home.   PT DURATION: 8 weeks  PLANNED INTERVENTIONS: 97110-Therapeutic exercises, 97530- Therapeutic activity, [id number]- Neuromuscular re-education, 97535- Self Care, 02859- Manual therapy, 97116- Gait training, [phone]- Wound care (first 20 sq cm), and Patient/Family education  PLAN FOR NEXT SESSION: begin slow marching, 4 step up with UE assist, lateral step up and hip hiking.  Progress as able.  Augustin Mclean, LPTA/CLT;  [facility] [phone]  05/05/2024, 4:56 PM  "

## 2024-05-05 NOTE — Telephone Encounter (Signed)
 Type of form received: [facility]  Additional comments:   Received by: Fax  Form should be Faxed/mailed to: (address/ fax #)  [phone]  Is patient requesting call for pickup:  Form placed:  Provider bin  Attach charge sheet.  Provider will determine charge.  Individual made aware of 3-5 business day turn around Yes?

## 2024-05-05 NOTE — Telephone Encounter (Signed)
 I do not see where patient has seen you in epic. He did have a visit and established with Dr. Jerrell

## 2024-05-08 NOTE — Telephone Encounter (Signed)
 Obtained document and placed on providers desk for review

## 2024-05-08 NOTE — Telephone Encounter (Signed)
 Signed and placed in MA basket.  Thank you.

## 2024-05-09 ENCOUNTER — Ambulatory Visit (HOSPITAL_COMMUNITY): Admitting: Physical Therapy

## 2024-05-09 DIAGNOSIS — M25651 Stiffness of right hip, not elsewhere classified: Secondary | ICD-10-CM

## 2024-05-09 DIAGNOSIS — M25551 Pain in right hip: Secondary | ICD-10-CM

## 2024-05-09 DIAGNOSIS — M6281 Muscle weakness (generalized): Secondary | ICD-10-CM

## 2024-05-09 DIAGNOSIS — R262 Difficulty in walking, not elsewhere classified: Secondary | ICD-10-CM

## 2024-05-09 DIAGNOSIS — T81321D Disruption or dehiscence of closure of internal operation (surgical) wound of abdominal wall muscle or fascia, subsequent encounter: Secondary | ICD-10-CM

## 2024-05-09 NOTE — Telephone Encounter (Signed)
 Forms have been faxed and placed in scan folder

## 2024-05-09 NOTE — Therapy (Signed)
 " OUTPATIENT PHYSICAL THERAPY LOWER EXTREMITY /Wound Treatment   Patient Name: Connor Wang MRN: [id number] DOB:June 26, 1960, 64 y.o., male Today's Date: 05/09/2024  END OF SESSION:  PT End of Session - 05/09/24 1700     Visit Number 3    Number of Visits 16    Date for Recertification  06/28/24    Authorization Type medicare/[employer]    Progress Note Due on Visit 10    PT Start Time 1555    PT Stop Time 1650    PT Time Calculation (min) 55 min    Activity Tolerance Patient tolerated treatment well    Behavior During Therapy Connor Wang for tasks assessed/performed           Past Medical History:  Diagnosis Date   History of cholecystectomy 10/11/2020   Vancomycin adverse reaction 02/10/2024   Past Surgical History:  Procedure Laterality Date   CHOLECYSTECTOMY N/A 10/11/2020   Procedure: LAPAROSCOPIC CHOLECYSTECTOMY WITH INTRAOPERATIVE CHOLANGIOGRAM;  Surgeon: Eletha Boas, MD;  Location: [facility];  Service: General;  Laterality: N/A;   COLONOSCOPY     Patient Active Problem List   Diagnosis Date Noted   Closed pelvic fracture (HCC) 05/01/2024   Obesity, Class III, BMI 40-49.9 (HCC) 05/01/2024   Nonhealing surgical wound 05/01/2024   Thoracic vertebral fracture (HCC) 05/01/2024   Colostomy in place [facility]) 03/16/2024   Hypercholesterolemia 12/20/2023    PCP: Jerrell Cleatus Ned, MD  REFERRING PROVIDER: Jerrell Cleatus Ned, MD  REFERRING DIAG: [phone] (ICD-10-CM) - Multiple closed fractures of pelvis with unstable disruption of pelvic ring with delayed healing, subsequent encounter Non healing surgical site wounds. [id number] - AMB REFERRAL TO WOUND CLINIC per MD  THERAPY DIAG:  S32.811G (ICD-10-CM) - Multiple closed fractures of pelvis with unstable disruption of pelvic ring with delayed healing, subsequent encounter Difficulty walking Muscle weakness  Rationale for Evaluation and Treatment: Rehabilitation HX: [name] is a 64 y.o. male who is Jehovah's  witness who presented on 12/04/23 s/p a crush injury. He was found to have Left 7,8 and right rib fractures, spine fractures requiring PSF, possible bladder wall injury (cysto without leak) as well as a Traumatic sigmoid colon transection with discontinuous sigmoid colon, mesenteric contusions, R lumbar hernia, Traumatic right posterior abdominal wall defect. He required OR for exploratory laparotomy, sigmoidoscopy, sigmoidectomy small bowel resection colostomy creation 8/2. He was also found to have multiple pelvic fractures which required OR for ex fix then later ORIF and perc screw. His post-op course was complicated by a pelvic wound infection that required debridement and wound vac placement. ID was consulted and he was discharged with IV antibiotics.   PT went to In patient rehab until 9/16 when readmitted due to surgical site infection.  Discharged on 01/26/24 back to IP rehag.      Currently living by himself and independent, back to driving, ambulates with the help of a rolling walker or just a cane at times. He has consultation with orthopedics in 2 days. ONSET DATE: 12/04/23 Hospitalized 12/04/23-12/30/23 In pt rehab:  12/30/23-01/18/24 RE hospitalized on 01/18/24-01/26/24 due to infection from wound site Taken to OR on 9/17 and grew coryne, E Cloacae, Staph Epi. Placed on vancomycin and po cipro and flagyl with plan for 6 weeks with planned end date 10/30  In patient rehab from 9/24-10/30/25 Home with daughter on 03/02/24 Currently living on his own and driving    SUBJECTIVE [id number]:[birth year]:   Pt states that he can tell that it is getting easier to raise his  leg up.  Eval:  Patient states that he was pinned between a 4x4 and a [employer] truck.  Pt recently went to ortho who states he can be FWB.  Pt is using a rollator to walk with this time.  Goes up steps with his cane one at a time: Can sit:  One hour Walk: With rollator 5 minute  Stand: no problem PERTINENT HISTORY: CXR - no acute  findings CT T/L - T8 VB fx, R ileum fx extending into sup R sacral ala CT C/A/P - L 7-8, R 11th rib fxs, traumatic colon transection with discontinuous sigmoid colon, small volume pneumoperitoneum, mesenteric hematoma, R>L retroperitoneal hematoma, extensive bilat pelvis fracture, displaced bladder traumatic R lumbar hernia containing RP fat and hemorrhage Pelvis xray- displaced obturator ring fxs, R ilaiac fx RLE xray- no femur fx      PAIN: 3/10 Are you having pain? Yes: Pain location: Rt hip  Pain description: sore Aggravating factors: sitting  Relieving factors: rest  PRECAUTIONS: Other: cellulitis   RED FLAGS: None   WEIGHT BEARING RESTRICTIONS: Yes PWB RT  FALLS:  Has patient fallen in last 6 months? No  LIVING ENVIRONMENT: Lives with: lives with their family and lives alone Lives in: House/apartment Stairs: Yes: Internal: 6 steps; on right going up and External: 3 steps; on right going up Has following equipment at home: Single point cane and Walker - 4 wheeled  OCCUPATION: retired   PLOF: Independent  PATIENT GOALS: Wounds to heal as well as to be able to walk without assistive device   NEXT MD VISIT: trying to establish  OBJECTIVE:  Note: Objective measures were completed at Evaluation unless otherwise noted.  DIAGNOSTIC FINDINGS: Per Ortho on 02/08/24:  X-rays taken here today independently reviewed by me, AP of the pelvis with inlet and outlet projections show maintenance of alignment and no failure of fixation compared to the most recent postoperative films.  Fracture line is still evident particularly up at the level of the iliac crest where the bulk of the displacement is.  Overall the alignment of the right hemipelvis is good and is unchanged.   Discussed with the patient I do not feel that there is adequate healing to permit full weightbearing particularly since he still having a fair amount of pain when there is direct pressure on the right side that  likely is indicating that there is insufficient healing there for weightbearing as well. Pt returned to MD on 04/18/24 now FWB COGNITION: Overall cognitive status: Within functional limits for tasks assessed     SENSATION: Not tested  POSTURE: No Significant postural limitations  PALPATION: Noted scar tissue located at all wound sites; pt states scar tissue on his back as well from surgery.   LOWER EXTREMITY ROM: not formally tested   Active ROM Right eval Left eval  Hip flexion    Hip extension    Hip abduction    Hip adduction    Hip internal rotation    Hip external rotation    Knee flexion    Knee extension    Ankle dorsiflexion    Ankle plantarflexion    Ankle inversion    Ankle eversion     (Blank rows = not tested)  LOWER EXTREMITY MMT:  MMT Right eval Left eval  Hip flexion 2-   Hip extension    Hip abduction 2   Hip adduction    Hip internal rotation    Hip external rotation    Knee flexion  Knee extension 5 5  Ankle dorsiflexion 4 5  Ankle plantarflexion    Ankle inversion    Ankle eversion     (Blank rows = not tested)   FUNCTIONAL TESTS:  30 seconds chair stand test: uses UE B:  6x 2 minute walk test: 236 ft with rollator                                                                                                                               TREATMENT DATE: 05/09/24:   05/09/24 0001  Subjective Assessment  Subjective No reports of pain in wounds, does c/o pain in Rt adductor from knee to groin, pain scale 5/10  Patient and Family Stated Goals Wounds to heal and to walk better.  Date of Onset 12/04/23  Prior Treatments Acute, IP rehab and [facility]  Evaluation and Treatment  Evaluation and Treatment Procedures Explained to Patient/Family Yes  Evaluation and Treatment Procedures agreed to  Wound 05/03/24 0835 Surgical Closed Surgical Incision Abdomen Left;Lower  Date First Assessed/Time First Assessed: 05/03/24 0835   Present on Original  Admission: Yes  Primary Wound Type: Surgical  Secondary Wound Type - Surgical: Closed Surgical Incision  Location: Abdomen  Location Orientation: Left;Lower  Site / Wound Assessment Clean;Red  Wound Length (cm)  (.9x3 cm wound is hypergranulated)  Drainage Description Serous  Drainage Amount Moderate  Dressing Type Gauze (Comment)  Dressing Changed Changed  Dressing Status Old drainage  Margins Attached edges (approximated)  Treatment Cleansed;Other (Comment) (manual to scar tissue)  Wound Therapy - Assess/Plan/Recommendations  Wound Therapy - Clinical Statement see below  Wound Therapy - Functional Problem List see below  Factors Delaying/Impairing Wound Healing Altered sensation;Infection - systemic/local  Hydrotherapy Plan Dressing change;Patient/family education  Wound Therapy - Frequency 2X / week  Wound Therapy - Current Recommendations PT  Wound Plan silverhydrofiber, foam with medipore tape to attempt to decrease hypergranulation.  Wound Therapy  Dressing  silverhydrofiber, 4x4, foam and medipore tape.    Nustep level 3 x 10:00 Standing 10 sit to stand  10 heel raises 10 lunges 10 step ups 10 lateral step ups 10 hip hikes 10 hip diagonals with red t-band 5 marching   PATIENT EDUCATION:  Education details: HEP (pt also has a HEP for [facility] therapist which is extensive. Standing hamstring curls, squats, hip abduction,  Person educated: Patient Education method: Explanation, Verbal cues, and Handouts HOME EXERCISE PROGRAM: Access Code: [email or web address] URL: [email or web address] Date: 05/03/2024 Prepared by: Montie Metro   Exercises - Supine Bridge  - 2 x daily - 7 x weekly - 1 sets - 5-20 reps - 5 hold - Beginner Side Leg Lift  - 2 x daily - 7 x weekly - 1 sets - 10 reps - 5 hold - Supine Heel Slide  - 2 x daily - 7 x weekly - 1 sets - 20 reps - 5 hold 05/05/24 Clam Sit to stand  05/09/24 Forward Fall Out Lunge  -  1 x daily - 7 x weekly - 1 sets - 10 reps  - 5 hold - Forward Step Up with Counter Support  - 1 x daily - 7 x weekly - 1 sets - 10 reps - 5 hold - Lateral Step Up with Counter Support  - 1 x daily - 7 x weekly - 1 sets - 10 reps - 5 hold - Hip Hiking on Step  - 1 x daily - 7 x weekly - 1 sets - 10 reps - 5 hold - Diagonal Hip Extension with Resistance  - 1 x daily - 7 x weekly - 1 sets - 10 reps - 5 hold  ASSESSMENT:   CLINICAL IMPRESSION: Patient with noted increased strength of his right leg. His Rt inguinal wound is now healed.   He continues to have decreased activity tolerance, decreased strength, increased pain, decreased balance, decreased ROM and a non healing wound and will continue to  benefit from skilled PT to address these issues and maximize his functional ability.     OBJECTIVE IMPAIRMENTS: Abnormal gait, cardiopulmonary status limiting activity, decreased activity tolerance, decreased balance, decreased mobility, difficulty walking, decreased ROM, decreased strength, pain, and decreased skin integrity.    ACTIVITY LIMITATIONS: carrying, lifting, sitting, stairs, transfers, dressing, reach over head, hygiene/grooming, and locomotion level   PARTICIPATION LIMITATIONS: meal prep, cleaning, shopping, and community activity   PERSONAL FACTORS: Time since onset of injury/illness/exacerbation are also affecting patient's functional outcome.    REHAB POTENTIAL: Good   CLINICAL DECISION MAKING: Stable/uncomplicated   EVALUATION COMPLEXITY: Moderate     GOALS: Goals reviewed with patient? Yes   SHORT TERM GOALS: Target date: 05/31/24 Rt wound to be healed  Baseline: Goal status: on-going   2.  Pain in sacral area to be no greater than a 2/10 Baseline:  Goal status: on-going   3.  Pt to be able to walk with a rollator for 20 minutes for short shopping trips  Baseline:  Goal status:on-going   4.  PT to tolerate sitting for an hour and a half for traveling  Baseline:  Goal status: on-going   5.  Mm grades  to be increased by 1 grade to allow the above to occur.  Baseline:  Goal status: on-going   6.  Pt to be able to walk for 280 ft in a two minute period.  Baseline:  Goal status:on-going   LONG TERM GOALS: Target date: 06/28/24   PT to have no pain in his Rt hip  Baseline:  Goal status: on-going   2.  PT to be able to walk for 30 minutes with a cane Baseline:  Goal status: on-going   3.  Pt to be able to walk 368ft with a cane in 2 minute period of time.  Baseline:  Goal status:on-going   4.  Pt to be able to come sit to stand without using UE assist.  Baseline:  Goal status: on-going   5.  PT to be able to complete 10 sit to stand in 30 seconds  Baseline:  Goal status: on-going   6.  PT to be able to go up and down steps in a reciprocal manner with one rail Baseline:  Goal status:on-going     PLAN:   PT FREQUENCY: 2x/week  ortho referral stated 3x pt request 2x as he is completing HEP at home.    PT DURATION: 8 weeks   PLANNED INTERVENTIONS: 97110-Therapeutic exercises, 97530- Therapeutic activity, [id number]- Neuromuscular re-education, 97535- Self Care, 02859-  Manual therapy, [id number]- Gait training, 02402- Wound care (first 20 sq cm), and Patient/Family education   PLAN FOR NEXT SESSION: continue to progress strengthening and wound care  Montie Metro, PT CLT [phone]   "

## 2024-05-10 ENCOUNTER — Telehealth: Payer: Self-pay | Admitting: Student in an Organized Health Care Education/Training Program

## 2024-05-10 NOTE — Telephone Encounter (Signed)
 Type of form received: [facility]  Additional comments:   Received by: Fax  Form should be Faxed/mailed to: (address/ fax #)  [phone]  Is patient requesting call for pickup:  Form placed:  Provider bin  Attach charge sheet.  Provider will determine charge.  Individual made aware of 3-5 business day turn around Yes?

## 2024-05-10 NOTE — Telephone Encounter (Signed)
 Forms placed on providers desk.  ?

## 2024-05-11 ENCOUNTER — Ambulatory Visit (HOSPITAL_COMMUNITY): Admitting: Physical Therapy

## 2024-05-11 ENCOUNTER — Ambulatory Visit: Admitting: Student in an Organized Health Care Education/Training Program

## 2024-05-11 ENCOUNTER — Encounter (HOSPITAL_COMMUNITY): Payer: Self-pay

## 2024-05-11 DIAGNOSIS — M6281 Muscle weakness (generalized): Secondary | ICD-10-CM

## 2024-05-11 DIAGNOSIS — R262 Difficulty in walking, not elsewhere classified: Secondary | ICD-10-CM | POA: Diagnosis not present

## 2024-05-11 DIAGNOSIS — M25551 Pain in right hip: Secondary | ICD-10-CM

## 2024-05-11 DIAGNOSIS — T81321D Disruption or dehiscence of closure of internal operation (surgical) wound of abdominal wall muscle or fascia, subsequent encounter: Secondary | ICD-10-CM

## 2024-05-11 DIAGNOSIS — M25651 Stiffness of right hip, not elsewhere classified: Secondary | ICD-10-CM

## 2024-05-11 NOTE — Telephone Encounter (Signed)
 Signed and placed in MA basket.  Thank you.

## 2024-05-11 NOTE — Therapy (Signed)
 " OUTPATIENT PHYSICAL THERAPY LOWER EXTREMITY /Wound Treatment   Patient Name: Connor Wang MRN: [id number] DOB:1960/12/05, 64 y.o., male Today's Date: 05/11/2024  END OF SESSION:  PT End of Session - 05/11/24 0937     Visit Number 4    Number of Visits 16    Date for Recertification  06/28/24    Authorization Type medicare/[employer]    Progress Note Due on Visit 10    PT Start Time 0810    PT Stop Time 0916    PT Time Calculation (min) 66 min    Activity Tolerance Patient tolerated treatment well    Behavior During Therapy [facility] for tasks assessed/performed              Past Medical History:  Diagnosis Date   History of cholecystectomy 10/11/2020   Vancomycin adverse reaction 02/10/2024   Past Surgical History:  Procedure Laterality Date   CHOLECYSTECTOMY N/A 10/11/2020   Procedure: LAPAROSCOPIC CHOLECYSTECTOMY WITH INTRAOPERATIVE CHOLANGIOGRAM;  Surgeon: Eletha Boas, MD;  Location: [facility];  Service: General;  Laterality: N/A;   COLONOSCOPY     Patient Active Problem List   Diagnosis Date Noted   Closed pelvic fracture (HCC) 05/01/2024   Obesity, Class III, BMI 40-49.9 (HCC) 05/01/2024   Nonhealing surgical wound 05/01/2024   Thoracic vertebral fracture (HCC) 05/01/2024   Colostomy in place [facility]) 03/16/2024   Hypercholesterolemia 12/20/2023    PCP: Jerrell Cleatus Ned, MD  REFERRING PROVIDER: Jerrell Cleatus Ned, MD  REFERRING DIAG: [id number] (ICD-10-CM) - Multiple closed fractures of pelvis with unstable disruption of pelvic ring with delayed healing, subsequent encounter Non healing surgical site wounds. [id number] - AMB REFERRAL TO WOUND CLINIC per MD  THERAPY DIAG:  S32.811G (ICD-10-CM) - Multiple closed fractures of pelvis with unstable disruption of pelvic ring with delayed healing, subsequent encounter Difficulty walking Muscle weakness  Rationale for Evaluation and Treatment: Rehabilitation HX: Connor Wang is a 64 y.o. male who is Jehovah's  witness who presented on 12/04/23 s/p a crush injury. He was found to have Left 7,8 and right rib fractures, spine fractures requiring PSF, possible bladder wall injury (cysto without leak) as well as a Traumatic sigmoid colon transection with discontinuous sigmoid colon, mesenteric contusions, R lumbar hernia, Traumatic right posterior abdominal wall defect. He required OR for exploratory laparotomy, sigmoidoscopy, sigmoidectomy small bowel resection colostomy creation 8/2. He was also found to have multiple pelvic fractures which required OR for ex fix then later ORIF and perc screw. His post-op course was complicated by a pelvic wound infection that required debridement and wound vac placement. ID was consulted and he was discharged with IV antibiotics.   PT went to In patient rehab until 9/16 when readmitted due to surgical site infection.  Discharged on 01/26/24 back to IP rehag.      Currently living by himself and independent, back to driving, ambulates with the help of a rolling walker or just a cane at times. He has consultation with orthopedics in 2 days. ONSET DATE: 12/04/23 Hospitalized 12/04/23-12/30/23 In pt rehab:  12/30/23-01/18/24 RE hospitalized on 01/18/24-01/26/24 due to infection from wound site Taken to OR on 9/17 and grew coryne, E Cloacae, Staph Epi. Placed on vancomycin and po cipro and flagyl with plan for 6 weeks with planned end date 10/30  In patient rehab from 9/24-10/30/25 Home with daughter on 03/02/24 Currently living on his own and driving    SUBJECTIVE [id number]:[birth year]:   Pt states overall pain is improving, however, he was very sore  from last treatment.   Eval:  Patient states that he was pinned between a 4x4 and a [employer] truck.  Pt recently went to ortho who states he can be FWB.  Pt is using a rollator to walk with this time.  Goes up steps with his cane one at a time: Can sit:  One hour Walk: With rollator 5 minute  Stand: no problem PERTINENT HISTORY: CXR - no  acute findings CT T/L - T8 VB fx, R ileum fx extending into sup R sacral ala CT C/A/P - L 7-8, R 11th rib fxs, traumatic colon transection with discontinuous sigmoid colon, small volume pneumoperitoneum, mesenteric hematoma, R>L retroperitoneal hematoma, extensive bilat pelvis fracture, displaced bladder traumatic R lumbar hernia containing RP fat and hemorrhage Pelvis xray- displaced obturator ring fxs, R ilaiac fx RLE xray- no femur fx      PAIN: 2/10 Are you having pain? Yes: Pain location: Rt hip  Pain description: sore Aggravating factors: sitting  Relieving factors: rest  PRECAUTIONS: Other: cellulitis   RED FLAGS: None   WEIGHT BEARING RESTRICTIONS: Yes PWB RT  FALLS:  Has patient fallen in last 6 months? No  LIVING ENVIRONMENT: Lives with: lives with their family and lives alone Lives in: House/apartment Stairs: Yes: Internal: 6 steps; on right going up and External: 3 steps; on right going up Has following equipment at home: Single point cane and Walker - 4 wheeled  OCCUPATION: retired   PLOF: Independent  PATIENT GOALS: Wounds to heal as well as to be able to walk without assistive device   NEXT MD VISIT: trying to establish  OBJECTIVE:  Note: Objective measures were completed at Evaluation unless otherwise noted.  DIAGNOSTIC FINDINGS: Per Ortho on 02/08/24:  X-rays taken here today independently reviewed by me, AP of the pelvis with inlet and outlet projections show maintenance of alignment and no failure of fixation compared to the most recent postoperative films.  Fracture line is still evident particularly up at the level of the iliac crest where the bulk of the displacement is.  Overall the alignment of the right hemipelvis is good and is unchanged.   Discussed with the patient I do not feel that there is adequate healing to permit full weightbearing particularly since he still having a fair amount of pain when there is direct pressure on the right side that  likely is indicating that there is insufficient healing there for weightbearing as well. Pt returned to MD on 04/18/24 now FWB COGNITION: Overall cognitive status: Within functional limits for tasks assessed     SENSATION: Not tested  POSTURE: No Significant postural limitations  PALPATION: Noted scar tissue located at all wound sites; pt states scar tissue on his back as well from surgery.   LOWER EXTREMITY ROM: not formally tested   Active ROM Right eval Left eval  Hip flexion    Hip extension    Hip abduction    Hip adduction    Hip internal rotation    Hip external rotation    Knee flexion    Knee extension    Ankle dorsiflexion    Ankle plantarflexion    Ankle inversion    Ankle eversion     (Blank rows = not tested)  LOWER EXTREMITY MMT:  MMT Right eval Left eval  Hip flexion 2-   Hip extension    Hip abduction 2   Hip adduction    Hip internal rotation    Hip external rotation    Knee flexion  Knee extension 5 5  Ankle dorsiflexion 4 5  Ankle plantarflexion    Ankle inversion    Ankle eversion     (Blank rows = not tested)   FUNCTIONAL TESTS:  30 seconds chair stand test: uses UE B:  6x 2 minute walk test: 236 ft with rollator                                                                                                                               TREATMENT DATE: 05/11/24:  Nustep level 3 x 10:00 Standing Side step x 2 Placing Rt leg on 4 step x 10   10 sit to stand  12 heel raises 12 lunges 12 step ups 12 lateral step ups 12 hip hikes 12 hip diagonals with red t-band 6 marching    05/11/24 0001  Subjective Assessment  Subjective Continues to feel stronger.  Patient and Family Stated Goals Wounds to heal and to walk better.  Date of Onset 12/04/23  Prior Treatments Acute, IP rehab and HH  Pain Assessment  Pain Scale 0-10  Pain Score 2  Evaluation and Treatment  Evaluation and Treatment Procedures Explained to Patient/Family  Yes  Evaluation and Treatment Procedures agreed to  Wound 05/03/24 0835 Surgical Closed Surgical Incision Abdomen Left;Lower  Date First Assessed/Time First Assessed: 05/03/24 0835   Present on Original Admission: Yes  Primary Wound Type: Surgical  Secondary Wound Type - Surgical: Closed Surgical Incision  Location: Abdomen  Location Orientation: Left;Lower  Wound Image   Site / Wound Assessment Yellow, red  Wound Length (cm)  (.9x 2.6) was .9x3 cm  Drainage Description Serous  Drainage Amount Moderate  Dressing Type Gauze (Comment);Silver hydrofiber;Tape dressing;Other (Comment) (foam over wound to assist in decreasing hypergranulation.)  Dressing Status Old drainage  Margins Attached edges (approximated)  Treatment Cleansed;Other (Comment) (manual to scar area, hard foam over dressing to try and decrease hypergranulation.)  Wound Therapy - Assess/Plan/Recommendations  Wound Therapy - Clinical Statement see below  Wound Therapy - Functional Problem List see below  Factors Delaying/Impairing Wound Healing Altered sensation;Infection - systemic/local  Hydrotherapy Plan Dressing change;Patient/family education  Wound Therapy - Frequency 2X / week  Wound Therapy - Current Recommendations PT  Wound Plan silverhydrofiber, foam with medipore tape to attempt to decrease hypergranulation.  Wound Therapy  Dressing  silverhydrofiber, 4x4, foam and medipore tape.    PATIENT EDUCATION:  Education details: HEP (pt also has a HEP for [facility] therapist which is extensive. Standing hamstring curls, squats, hip abduction,  Person educated: Patient Education method: Explanation, Verbal cues, and Handouts HOME EXERCISE PROGRAM: Access Code: [email or web address] URL: [email or web address] Date: 05/03/2024 Prepared by: Montie Metro   Exercises - Supine Bridge  - 2 x daily - 7 x weekly - 1 sets - 5-20 reps - 5 hold - Beginner Side Leg Lift  - 2 x daily - 7 x weekly - 1 sets - 10 reps - 5 hold -  Supine  Heel Slide  - 2 x daily - 7 x weekly - 1 sets - 20 reps - 5 hold 05/05/24 Clam Sit to stand  05/09/24 Forward Fall Out Lunge  - 1 x daily - 7 x weekly - 1 sets - 10 reps - 5 hold - Forward Step Up with Counter Support  - 1 x daily - 7 x weekly - 1 sets - 10 reps - 5 hold - Lateral Step Up with Counter Support  - 1 x daily - 7 x weekly - 1 sets - 10 reps - 5 hold - Hip Hiking on Step  - 1 x daily - 7 x weekly - 1 sets - 10 reps - 5 hold - Diagonal Hip Extension with Resistance  - 1 x daily - 7 x weekly - 1 sets - 10 reps - 5 hold  ASSESSMENT:   CLINICAL IMPRESSION: Patient states that he was very sore after last treatment.  He is not so bad today.  Pt needed to take one rest break during exercises.    Pt needs constant verbal cuing to keep in good form while exercising.  He continues to have decreased activity tolerance, decreased strength, increased pain, decreased balance, decreased ROM and a non healing wound and will continue to  benefit from skilled PT to address these issues and maximize his functional ability.     OBJECTIVE IMPAIRMENTS: Abnormal gait, cardiopulmonary status limiting activity, decreased activity tolerance, decreased balance, decreased mobility, difficulty walking, decreased ROM, decreased strength, pain, and decreased skin integrity.    ACTIVITY LIMITATIONS: carrying, lifting, sitting, stairs, transfers, dressing, reach over head, hygiene/grooming, and locomotion level   PARTICIPATION LIMITATIONS: meal prep, cleaning, shopping, and community activity   PERSONAL FACTORS: Time since onset of injury/illness/exacerbation are also affecting patient's functional outcome.    REHAB POTENTIAL: Good   CLINICAL DECISION MAKING: Stable/uncomplicated   EVALUATION COMPLEXITY: Moderate     GOALS: Goals reviewed with patient? Yes   SHORT TERM GOALS: Target date: 05/31/24 Rt wound to be healed  Baseline: Goal status: met   2.  Pain in sacral area to be no greater  than a 2/10 Baseline:  Goal status: on-going   3.  Pt to be able to walk with a rollator for 20 minutes for short shopping trips  Baseline:  Goal status:on-going   4.  PT to tolerate sitting for an hour and a half for traveling  Baseline:  Goal status: on-going   5.  Mm grades to be increased by 1 grade to allow the above to occur.  Baseline:  Goal status: on-going   6.  Pt to be able to walk for 280 ft in a two minute period.  Baseline:  Goal status:on-going   LONG TERM GOALS: Target date: 06/28/24   PT to have no pain in his Rt hip  Baseline:  Goal status: on-going   2.  PT to be able to walk for 30 minutes with a cane Baseline:  Goal status: on-going   3.  Pt to be able to walk 37ft with a cane in 2 minute period of time.  Baseline:  Goal status:on-going   4.  Pt to be able to come sit to stand without using UE assist.  Baseline:  Goal status: partially met    5.  PT to be able to complete 10 sit to stand in 30 seconds  Baseline:  Goal status: on-going   6.  PT to be able to go up and down steps  in a reciprocal manner with one rail Baseline:  Goal status:on-going     PLAN:   PT FREQUENCY: 2x/week  ortho referral stated 3x pt request 2x as he is completing HEP at home.    PT DURATION: 8 weeks   PLANNED INTERVENTIONS: 97110-Therapeutic exercises, 97530- Therapeutic activity, [id number]- Neuromuscular re-education, 97535- Self Care, [id number]- Manual therapy, [phone]- Gait training, [phone]- Wound care (first 20 sq cm), and Patient/Family education   PLAN FOR NEXT SESSION: continue to progress strengthening and wound care  Montie Metro, PT CLT [phone]   "

## 2024-05-12 NOTE — Telephone Encounter (Signed)
 Faxed documents and scanned to to patients chart

## 2024-05-16 ENCOUNTER — Ambulatory Visit (HOSPITAL_COMMUNITY): Admitting: Physical Therapy

## 2024-05-16 DIAGNOSIS — T81321D Disruption or dehiscence of closure of internal operation (surgical) wound of abdominal wall muscle or fascia, subsequent encounter: Secondary | ICD-10-CM

## 2024-05-16 DIAGNOSIS — R262 Difficulty in walking, not elsewhere classified: Secondary | ICD-10-CM

## 2024-05-16 DIAGNOSIS — M25551 Pain in right hip: Secondary | ICD-10-CM

## 2024-05-16 DIAGNOSIS — M6281 Muscle weakness (generalized): Secondary | ICD-10-CM

## 2024-05-16 DIAGNOSIS — M25651 Stiffness of right hip, not elsewhere classified: Secondary | ICD-10-CM

## 2024-05-16 NOTE — Therapy (Signed)
 " OUTPATIENT PHYSICAL THERAPY LOWER EXTREMITY /Wound Treatment   Patient Name: [name] MRN: [id number] DOB:1961-04-05, 64 y.o., male Today's Date: 05/16/2024  END OF SESSION:  PT End of Session - 05/16/24 0940     Visit Number 5    Number of Visits 16    Date for Recertification  06/28/24    Authorization Type medicare/[employer]    Progress Note Due on Visit 10    PT Start Time 0800    PT Stop Time 0908    PT Time Calculation (min) 68 min    Activity Tolerance Patient tolerated treatment well    Behavior During Therapy [facility] for tasks assessed/performed               Past Medical History:  Diagnosis Date   History of cholecystectomy 10/11/2020   Vancomycin adverse reaction 02/10/2024   Past Surgical History:  Procedure Laterality Date   CHOLECYSTECTOMY N/A 10/11/2020   Procedure: LAPAROSCOPIC CHOLECYSTECTOMY WITH INTRAOPERATIVE CHOLANGIOGRAM;  Surgeon: Eletha Boas, MD;  Location: [facility];  Service: General;  Laterality: N/A;   COLONOSCOPY     Patient Active Problem List   Diagnosis Date Noted   Closed pelvic fracture (HCC) 05/01/2024   Obesity, Class III, BMI 40-49.9 (HCC) 05/01/2024   Nonhealing surgical wound 05/01/2024   Thoracic vertebral fracture (HCC) 05/01/2024   Colostomy in place [facility]) 03/16/2024   Hypercholesterolemia 12/20/2023    PCP: Jerrell Cleatus Ned, MD  REFERRING PROVIDER: Jerrell Cleatus Ned, MD  REFERRING DIAG: [phone] (ICD-10-CM) - Multiple closed fractures of pelvis with unstable disruption of pelvic ring with delayed healing, subsequent encounter Non healing surgical site wounds. [id number] - AMB REFERRAL TO WOUND CLINIC per MD  THERAPY DIAG:  S32.811G (ICD-10-CM) - Multiple closed fractures of pelvis with unstable disruption of pelvic ring with delayed healing, subsequent encounter Difficulty walking Muscle weakness  Rationale for Evaluation and Treatment: Rehabilitation HX: Connor Wang is a 64 y.o. male who is  [employer] who presented on 12/04/23 s/p a crush injury. He was found to have Left 7,8 and right rib fractures, spine fractures requiring PSF, possible bladder wall injury (cysto without leak) as well as a Traumatic sigmoid colon transection with discontinuous sigmoid colon, mesenteric contusions, R lumbar hernia, Traumatic right posterior abdominal wall defect. He required OR for exploratory laparotomy, sigmoidoscopy, sigmoidectomy small bowel resection colostomy creation 8/2. He was also found to have multiple pelvic fractures which required OR for ex fix then later ORIF and perc screw. His post-op course was complicated by a pelvic wound infection that required debridement and wound vac placement. ID was consulted and he was discharged with IV antibiotics.   PT went to In patient rehab until 9/16 when readmitted due to surgical site infection.  Discharged on 01/26/24 back to IP rehag.      Currently living by himself and independent, back to driving, ambulates with the help of a rolling walker or just a cane at times. He has consultation with orthopedics in 2 days. ONSET DATE: 12/04/23 Hospitalized 12/04/23-12/30/23 In pt rehab:  12/30/23-01/18/24 RE hospitalized on 01/18/24-01/26/24 due to infection from wound site Taken to OR on 9/17 and grew coryne, E Cloacae, Staph Epi. Placed on vancomycin and po cipro and flagyl with plan for 6 weeks with planned end date 10/30  In patient rehab from 9/24-10/30/25 Home with daughter on 03/02/24 Currently living on his own and driving    SUBJECTIVE [id number]:8/86/73:  PT states that he hurts the most in the mornings.  Right now his pain is at a 2/10.  He has been very sore since his last treatment.    Eval:  Patient states that he was pinned between a 4x4 and a [employer] truck.  Pt recently went to ortho who states he can be FWB.  Pt is using a rollator to walk with this time.  Goes up steps with his cane one at a time: Can sit:  One hour Walk: With rollator 5  minute  Stand: no problem PERTINENT HISTORY: CXR - no acute findings CT T/L - T8 VB fx, R ileum fx extending into sup R sacral ala CT C/A/P - L 7-8, R 11th rib fxs, traumatic colon transection with discontinuous sigmoid colon, small volume pneumoperitoneum, mesenteric hematoma, R>L retroperitoneal hematoma, extensive bilat pelvis fracture, displaced bladder traumatic R lumbar hernia containing RP fat and hemorrhage Pelvis xray- displaced obturator ring fxs, R ilaiac fx RLE xray- no femur fx      PAIN: 2/10 Are you having pain? Yes: Pain location: Rt hip  Pain description: sore Aggravating factors: sitting  Relieving factors: rest  PRECAUTIONS: Other: cellulitis   RED FLAGS: None   WEIGHT BEARING RESTRICTIONS: Yes PWB RT  FALLS:  Has patient fallen in last 6 months? No  LIVING ENVIRONMENT: Lives with: lives with their family and lives alone Lives in: House/apartment Stairs: Yes: Internal: 6 steps; on right going up and External: 3 steps; on right going up Has following equipment at home: Single point cane and Walker - 4 wheeled  OCCUPATION: retired   PLOF: Independent  PATIENT GOALS: Wounds to heal as well as to be able to walk without assistive device   NEXT MD VISIT: trying to establish  OBJECTIVE:  Note: Objective measures were completed at Evaluation unless otherwise noted.  DIAGNOSTIC FINDINGS: Per Ortho on 02/08/24:  X-rays taken here today independently reviewed by me, AP of the pelvis with inlet and outlet projections show maintenance of alignment and no failure of fixation compared to the most recent postoperative films.  Fracture line is still evident particularly up at the level of the iliac crest where the bulk of the displacement is.  Overall the alignment of the right hemipelvis is good and is unchanged.   Discussed with the patient I do not feel that there is adequate healing to permit full weightbearing particularly since he still having a fair amount of  pain when there is direct pressure on the right side that likely is indicating that there is insufficient healing there for weightbearing as well. Pt returned to MD on 04/18/24 now FWB COGNITION: Overall cognitive status: Within functional limits for tasks assessed     SENSATION: Not tested  POSTURE: No Significant postural limitations  PALPATION: Noted scar tissue located at all wound sites; pt states scar tissue on his back as well from surgery.   LOWER EXTREMITY ROM: not formally tested   Active ROM Right eval Left eval  Hip flexion    Hip extension    Hip abduction    Hip adduction    Hip internal rotation    Hip external rotation    Knee flexion    Knee extension    Ankle dorsiflexion    Ankle plantarflexion    Ankle inversion    Ankle eversion     (Blank rows = not tested)  LOWER EXTREMITY MMT:  MMT Right eval Left eval  Hip flexion 2-   Hip extension    Hip abduction 2   Hip adduction  Hip internal rotation    Hip external rotation    Knee flexion    Knee extension 5 5  Ankle dorsiflexion 4 5  Ankle plantarflexion    Ankle inversion    Ankle eversion     (Blank rows = not tested)   FUNCTIONAL TESTS:  30 seconds chair stand test: uses UE B:  6x 2 minute walk test: 236 ft with rollator                                                                                                                               TREATMENT DATE: 05/16/24:   05/16/24 0001  Subjective Assessment  Subjective Pt is not sure what happened but the inside of his leg is very sore, better yesterday  Patient and Family Stated Goals Wounds to heal and to walk better.  Date of Onset 12/04/23  Prior Treatments Acute, IP rehab and [facility]  Evaluation and Treatment  Evaluation and Treatment Procedures Explained to Patient/Family Yes  Evaluation and Treatment Procedures agreed to  Wound 05/03/24 0835 Surgical Closed Surgical Incision Abdomen Left;Lower  Date First Assessed/Time First  Assessed: 05/03/24 0835   Present on Original Admission: Yes  Primary Wound Type: Surgical  Secondary Wound Type - Surgical: Closed Surgical Incision  Location: Abdomen  Location Orientation: Left;Lower  Wound Image   Site / Wound Assessment Clean;Red  Wound Length (cm) 0.72 cm (.4 x 1.8cm)  Drainage Description Serous  Drainage Amount Moderate  Dressing Type Gauze (Comment);Silver hydrofiber;Tape dressing;Other (Comment) (foam over wound to assist in decreasing hypergranulation.)  Dressing Status Old drainage  Margins Attached edges (approximated)  Wound Therapy - Assess/Plan/Recommendations  Wound Therapy - Clinical Statement see below  Wound Therapy - Functional Problem List see below  Factors Delaying/Impairing Wound Healing Altered sensation;Infection - systemic/local  Hydrotherapy Plan Dressing change;Patient/family education  Wound Therapy - Frequency 2X / week  Wound Therapy - Current Recommendations PT  Wound Plan silverhydrofiber,  with medipore tape to attempt to decrease hypergranulation.  Wound Therapy  Dressing  silverhydrofiber, 4x4,  and medipore tape.       Nustep level 3 x 10:00 Standing 12 sit to stand with cushion in chair  12 heel raises 12 squats  12 lunges 12 hip hikes 12 hip diagonals  7 marching  Slant board stretch x 30 x 3   PATIENT EDUCATION:  Education details: HEP (pt also has a HEP for [facility] therapist which is extensive. Standing hamstring curls, squats, hip abduction,  Person educated: Patient Education method: Explanation, Verbal cues, and Handouts HOME EXERCISE PROGRAM: Access Code: [email or web address] URL: [email or web address] Date: 05/03/2024 Prepared by: Montie Metro   Exercises - Supine Bridge  - 2 x daily - 7 x weekly - 1 sets - 5-20 reps - 5 hold - Beginner Side Leg Lift  - 2 x daily - 7 x weekly - 1 sets - 10 reps - 5 hold - Supine Heel Slide  - 2 x  daily - 7 x weekly - 1 sets - 20 reps - 5 hold 05/05/24 Clam Sit to  stand  05/09/24 Forward Fall Out Lunge  - 1 x daily - 7 x weekly - 1 sets - 10 reps - 5 hold - Forward Step Up with Counter Support  - 1 x daily - 7 x weekly - 1 sets - 10 reps - 5 hold - Lateral Step Up with Counter Support  - 1 x daily - 7 x weekly - 1 sets - 10 reps - 5 hold - Hip Hiking on Step  - 1 x daily - 7 x weekly - 1 sets - 10 reps - 5 hold - Diagonal Hip Extension with Resistance  - 1 x daily - 7 x weekly - 1 sets - 10 reps - 5 hold  ASSESSMENT:   CLINICAL IMPRESSION:  Trial of not using foam with dressing as foam is irritating pt and hypergranulation is not as bad as it once was.  Therapist did not use resistance with theraband as pt continues to have significant soreness.  Limited exercises if pt had any discomfort with them; held step ups and raising foot to 4 height entirely.  Pt wound continues to slowly approximate.   Pt continues to need occasional verbal cuing to keep in good form while exercising.  He continues to have decreased activity tolerance, decreased strength, increased pain, decreased balance, decreased ROM and a non healing wound and will continue to  benefit from skilled PT to address these issues and maximize his functional ability.     OBJECTIVE IMPAIRMENTS: Abnormal gait, cardiopulmonary status limiting activity, decreased activity tolerance, decreased balance, decreased mobility, difficulty walking, decreased ROM, decreased strength, pain, and decreased skin integrity.    ACTIVITY LIMITATIONS: carrying, lifting, sitting, stairs, transfers, dressing, reach over head, hygiene/grooming, and locomotion level   PARTICIPATION LIMITATIONS: meal prep, cleaning, shopping, and community activity   PERSONAL FACTORS: Time since onset of injury/illness/exacerbation are also affecting patient's functional outcome.    REHAB POTENTIAL: Good   CLINICAL DECISION MAKING: Stable/uncomplicated   EVALUATION COMPLEXITY: Moderate     GOALS: Goals reviewed with patient? Yes    SHORT TERM GOALS: Target date: 05/31/24 Rt wound to be healed  Baseline: Goal status: met   2.  Pain in sacral area to be no greater than a 2/10 Baseline:  Goal status: on-going   3.  Pt to be able to walk with a rollator for 20 minutes for short shopping trips  Baseline:  Goal status:on-going   4.  PT to tolerate sitting for an hour and a half for traveling  Baseline:  Goal status: on-going   5.  Mm grades to be increased by 1 grade to allow the above to occur.  Baseline:  Goal status: on-going   6.  Pt to be able to walk for 280 ft in a two minute period.  Baseline:  Goal status:on-going   LONG TERM GOALS: Target date: 06/28/24   PT to have no pain in his Rt hip  Baseline:  Goal status: on-going   2.  PT to be able to walk for 30 minutes with a cane Baseline:  Goal status: on-going   3.  Pt to be able to walk 373ft with a cane in 2 minute period of time.  Baseline:  Goal status:on-going   4.  Pt to be able to come sit to stand without using UE assist.  Baseline:  Goal status: partially met    5.  PT to be able to complete 10 sit to stand in 30 seconds  Baseline:  Goal status: on-going   6.  PT to be able to go up and down steps in a reciprocal manner with one rail Baseline:  Goal status:on-going     PLAN:   PT FREQUENCY: 2x/week  ortho referral stated 3x pt request 2x as he is completing HEP at home.    PT DURATION: 8 weeks   PLANNED INTERVENTIONS: 97110-Therapeutic exercises, 97530- Therapeutic activity, [id number]- Neuromuscular re-education, 97535- Self Care, [id number]- Manual therapy, [phone]- Gait training, [phone]- Wound care (first 20 sq cm), and Patient/Family education   PLAN FOR NEXT SESSION: continue to progress strengthening and wound care  Montie Metro, PT CLT [phone]   "

## 2024-05-18 ENCOUNTER — Ambulatory Visit (HOSPITAL_COMMUNITY): Admitting: Physical Therapy

## 2024-05-18 DIAGNOSIS — M25651 Stiffness of right hip, not elsewhere classified: Secondary | ICD-10-CM

## 2024-05-18 DIAGNOSIS — M25551 Pain in right hip: Secondary | ICD-10-CM

## 2024-05-18 DIAGNOSIS — M6281 Muscle weakness (generalized): Secondary | ICD-10-CM

## 2024-05-18 DIAGNOSIS — R262 Difficulty in walking, not elsewhere classified: Secondary | ICD-10-CM

## 2024-05-18 NOTE — Therapy (Signed)
 " OUTPATIENT PHYSICAL THERAPY LOWER EXTREMITY /Wound Treatment   Patient Name: Connor Wang MRN: [id number] DOB:May 04, 1961, 64 y.o., male Today's Date: 05/18/2024  END OF SESSION:  PT End of Session - 05/18/24 0914     Visit Number 6    Number of Visits 16    Date for Recertification  06/28/24    Authorization Type medicare/[employer]    Progress Note Due on Visit 10    PT Start Time 0807    PT Stop Time 0911    PT Time Calculation (min) 64 min    Activity Tolerance Patient tolerated treatment well    Behavior During Therapy [facility] [address] for tasks assessed/performed                Past Medical History:  Diagnosis Date   History of cholecystectomy 10/11/2020   Vancomycin adverse reaction 02/10/2024   Past Surgical History:  Procedure Laterality Date   CHOLECYSTECTOMY N/A 10/11/2020   Procedure: LAPAROSCOPIC CHOLECYSTECTOMY WITH INTRAOPERATIVE CHOLANGIOGRAM;  Surgeon: Eletha Boas, MD;  Location: [facility];  Service: General;  Laterality: N/A;   COLONOSCOPY     Patient Active Problem List   Diagnosis Date Noted   Closed pelvic fracture (HCC) 05/01/2024   Obesity, Class III, BMI 40-49.9 (HCC) 05/01/2024   Nonhealing surgical wound 05/01/2024   Thoracic vertebral fracture (HCC) 05/01/2024   Colostomy in place [facility]) 03/16/2024   Hypercholesterolemia 12/20/2023    PCP: Jerrell Cleatus Ned, MD  REFERRING PROVIDER: Jerrell Cleatus Ned, MD  REFERRING DIAG: [phone] (ICD-10-CM) - Multiple closed fractures of pelvis with unstable disruption of pelvic ring with delayed healing, subsequent encounter Non healing surgical site wounds. [id number] - AMB REFERRAL TO WOUND CLINIC per MD  THERAPY DIAG:  S32.811G (ICD-10-CM) - Multiple closed fractures of pelvis with unstable disruption of pelvic ring with delayed healing, subsequent encounter Difficulty walking Muscle weakness  Rationale for Evaluation and Treatment: Rehabilitation HX: Connor Wang is a 64 y.o. male who is  [employer] who presented on 12/04/23 s/p a crush injury. He was found to have Left 7,8 and right rib fractures, spine fractures requiring PSF, possible bladder wall injury (cysto without leak) as well as a Traumatic sigmoid colon transection with discontinuous sigmoid colon, mesenteric contusions, R lumbar hernia, Traumatic right posterior abdominal wall defect. He required OR for exploratory laparotomy, sigmoidoscopy, sigmoidectomy small bowel resection colostomy creation 8/2. He was also found to have multiple pelvic fractures which required OR for ex fix then later ORIF and perc screw. His post-op course was complicated by a pelvic wound infection that required debridement and wound vac placement. ID was consulted and he was discharged with IV antibiotics.   PT went to In patient rehab until 9/16 when readmitted due to surgical site infection.  Discharged on 01/26/24 back to IP rehag.      Currently living by himself and independent, back to driving, ambulates with the help of a rolling walker or just a cane at times. He has consultation with orthopedics in 2 days. ONSET DATE: 12/04/23 Hospitalized 12/04/23-12/30/23 In pt rehab:  12/30/23-01/18/24 RE hospitalized on 01/18/24-01/26/24 due to infection from wound site Taken to OR on 9/17 and grew coryne, E Cloacae, Staph Epi. Placed on vancomycin and po cipro and flagyl with plan for 6 weeks with planned end date 10/30  In patient rehab from 9/24-10/30/25 Home with daughter on 03/02/24 Currently living on his own and driving    SUBJECTIVE [id number]:8/86/73:  PT states that he hurts the most in the mornings.  Right now his pain is at a 2/10.  He has been very sore since his last treatment.    Eval:  Patient states that he was pinned between a 4x4 and a [employer] truck.  Pt recently went to ortho who states he can be FWB.  Pt is using a rollator to walk with this time.  Goes up steps with his cane one at a time: Can sit:  One hour Walk: With rollator 5  minute  Stand: no problem PERTINENT HISTORY: CXR - no acute findings CT T/L - T8 VB fx, R ileum fx extending into sup R sacral ala CT C/A/P - L 7-8, R 11th rib fxs, traumatic colon transection with discontinuous sigmoid colon, small volume pneumoperitoneum, mesenteric hematoma, R>L retroperitoneal hematoma, extensive bilat pelvis fracture, displaced bladder traumatic R lumbar hernia containing RP fat and hemorrhage Pelvis xray- displaced obturator ring fxs, R ilaiac fx RLE xray- no femur fx      PAIN: 2/10 Are you having pain? Yes: Pain location: Rt hip  Pain description: sore Aggravating factors: sitting  Relieving factors: rest  PRECAUTIONS: Other: cellulitis   RED FLAGS: None   WEIGHT BEARING RESTRICTIONS: Yes PWB RT  FALLS:  Has patient fallen in last 6 months? No  LIVING ENVIRONMENT: Lives with: lives with their family and lives alone Lives in: House/apartment Stairs: Yes: Internal: 6 steps; on right going up and External: 3 steps; on right going up Has following equipment at home: Single point cane and Walker - 4 wheeled  OCCUPATION: retired   PLOF: Independent  PATIENT GOALS: Wounds to heal as well as to be able to walk without assistive device   NEXT MD VISIT: trying to establish  OBJECTIVE:  Note: Objective measures were completed at Evaluation unless otherwise noted.  DIAGNOSTIC FINDINGS: Per Ortho on 02/08/24:  X-rays taken here today independently reviewed by me, AP of the pelvis with inlet and outlet projections show maintenance of alignment and no failure of fixation compared to the most recent postoperative films.  Fracture line is still evident particularly up at the level of the iliac crest where the bulk of the displacement is.  Overall the alignment of the right hemipelvis is good and is unchanged.   Discussed with the patient I do not feel that there is adequate healing to permit full weightbearing particularly since he still having a fair amount of  pain when there is direct pressure on the right side that likely is indicating that there is insufficient healing there for weightbearing as well. Pt returned to MD on 04/18/24 now FWB COGNITION: Overall cognitive status: Within functional limits for tasks assessed     SENSATION: Not tested  POSTURE: No Significant postural limitations  PALPATION: Noted scar tissue located at all wound sites; pt states scar tissue on his back as well from surgery.   LOWER EXTREMITY ROM: not formally tested   Active ROM Right 05/18/24 Left eval  Hip flexion Via heelslide supine 50;     Hip extension Sidelying to neutral with assist   Hip abduction    Hip adduction    Hip internal rotation    Hip external rotation    Knee flexion    Knee extension *lacking 10   Ankle dorsiflexion    Ankle plantarflexion    Ankle inversion    Ankle eversion     (Blank rows = not tested)  *pt states at home as he relaxes he can get his knee straight.   LOWER EXTREMITY MMT:  MMT Right eval 05/18/24 Left eval  Hip flexion 2- 3-   Hip extension     Hip abduction 2 3-   Hip adduction     Hip internal rotation     Hip external rotation     Knee flexion     Knee extension 5 5 5   Ankle dorsiflexion 4 4+ 5  Ankle plantarflexion     Ankle inversion     Ankle eversion      (Blank rows = not tested)   FUNCTIONAL TESTS:  Eval: 30 seconds chair stand test: uses UE B:  6x; 05/18/24 : 6 with UE assist.   Eval:  2 minute walk test: 236 ft with rollator ; 05/18/24: 298 ft with rollator                                                                                                                              TREATMENT DATE: 05/18/24:   05/18/24 0001  Subjective Assessment  Subjective Pt states that his hip is better but he had a high amount of pain in his tailbone yesterday but it went away.  Patient and Family Stated Goals Wounds to heal and to walk better.  Date of Onset 12/04/23  Prior Treatments Acute, IP  rehab and HH  Pain Assessment  Pain Scale 0-10  Pain Score 0 (some soreness but no pain.)  Evaluation and Treatment  Evaluation and Treatment Procedures Explained to Patient/Family Yes  Evaluation and Treatment Procedures agreed to  Wound 05/03/24 0835 Surgical Closed Surgical Incision Abdomen Left;Lower  Date First Assessed/Time First Assessed: 05/03/24 0835   Present on Original Admission: Yes  Primary Wound Type: Surgical  Secondary Wound Type - Surgical: Closed Surgical Incision  Location: Abdomen  Location Orientation: Left;Lower  Site / Wound Assessment Clean;Red  Wound Length (cm)  (.3x.5 cm)  Drainage Description Serous;Serosanguineous  Drainage Amount Small  Dressing Type Gauze (Comment);Silver hydrofiber;Tape dressing;Other (Comment) (foam over wound to assist in decreasing hypergranulation.)  Dressing Changed Changed  Dressing Status Old drainage  Margins Attached edges (approximated)  Treatment Cleansed  Wound Therapy - Assess/Plan/Recommendations  Wound Therapy - Clinical Statement see below  Wound Therapy - Functional Problem List see below  Factors Delaying/Impairing Wound Healing Altered sensation;Infection - systemic/local  Hydrotherapy Plan Dressing change;Patient/family education  Wound Therapy - Frequency 2X / week  Wound Therapy - Current Recommendations PT  Wound Plan silverhydrofiber,  with medipore tape to attempt to decrease hypergranulation.  Wound Therapy  Dressing  silverhydrofiber, 4x4,  and medipore tape.   05/18/24   Nustep level 5 x 10:00] Supine: Heelslides x 5 Quad sets x 10  SLR x 5   Standing 15 heel raises 15 squats  12 lunges Rt and Left  12 hip diagonals  Slant board stretch x 30 x 3   PATIENT EDUCATION:  Education details: HEP (pt also has a HEP for [facility] therapist which is extensive. Standing hamstring curls, squats, hip abduction,  Person educated: Patient Education method: Explanation, Verbal cues, and Handouts HOME EXERCISE  PROGRAM: Access Code: [email or web address] URL: [email or web address] Date: 05/03/2024 Prepared by: Montie Metro   Exercises - Supine Bridge  - 2 x daily - 7 x weekly - 1 sets - 5-20 reps - 5 hold - Beginner Side Leg Lift  - 2 x daily - 7 x weekly - 1 sets - 10 reps - 5 hold - Supine Heel Slide  - 2 x daily - 7 x weekly - 1 sets - 20 reps - 5 hold 05/05/24 Clam Sit to stand  05/09/24 Forward Fall Out Lunge  - 1 x daily - 7 x weekly - 1 sets - 10 reps - 5 hold - Forward Step Up with Counter Support  - 1 x daily - 7 x weekly - 1 sets - 10 reps - 5 hold - Lateral Step Up with Counter Support  - 1 x daily - 7 x weekly - 1 sets - 10 reps - 5 hold - Hip Hiking on Step  - 1 x daily - 7 x weekly - 1 sets - 10 reps - 5 hold - Diagonal Hip Extension with Resistance  - 1 x daily - 7 x weekly - 1 sets - 10 reps - 5 hold  ASSESSMENT:   CLINICAL IMPRESSION:  PT wound approximating nicely with wound 1/2 the size as two days ago.  Anticipate discharge of wound next week.   Completed mm testing with good progress;  Functional testing improved walking test but sit to stand remains the same.  Standing exercises limited due to mm testing and functional testing.  Pt continues to need occasional verbal cuing to keep in good form while exercising.  He continues to have decreased activity tolerance, decreased strength, increased pain, decreased balance, decreased ROM and a non healing wound and will continue to  benefit from skilled PT to address these issues and maximize his functional ability.     OBJECTIVE IMPAIRMENTS: Abnormal gait, cardiopulmonary status limiting activity, decreased activity tolerance, decreased balance, decreased mobility, difficulty walking, decreased ROM, decreased strength, pain, and decreased skin integrity.    ACTIVITY LIMITATIONS: carrying, lifting, sitting, stairs, transfers, dressing, reach over head, hygiene/grooming, and locomotion level   PARTICIPATION LIMITATIONS: meal  prep, cleaning, shopping, and community activity   PERSONAL FACTORS: Time since onset of injury/illness/exacerbation are also affecting patient's functional outcome.    REHAB POTENTIAL: Good   CLINICAL DECISION MAKING: Stable/uncomplicated   EVALUATION COMPLEXITY: Moderate     GOALS: Goals reviewed with patient? Yes   SHORT TERM GOALS: Target date: 05/31/24 Rt wound to be healed  Baseline: Goal status: met   2.  Pain in sacral area to be no greater than a 2/10 Baseline:  Goal status: met    3.  Pt to be able to walk with a rollator for 20 minutes for short shopping trips  Baseline:  Goal status:on-going   4.  PT to tolerate sitting for an hour and a half for traveling  Baseline:  Goal status: on-going   5.  Mm grades to be increased by 1 grade to allow the above to occur.  Baseline:  Goal status: on-going   6.  Pt to be able to walk for 280 ft in a two minute period.  Baseline:  Goal status:on-going   LONG TERM GOALS: Target date: 06/28/24   PT to have no pain in his Rt hip  Baseline:  Goal status: on-going   2.  PT to be  able to walk for 30 minutes with a cane Baseline:  Goal status: on-going   3.  Pt to be able to walk 382ft with a cane in 2 minute period of time.  Baseline:  Goal status:on-going   4.  Pt to be able to come sit to stand without using UE assist.  Baseline:  Goal status: partially met    5.  PT to be able to complete 10 sit to stand in 30 seconds  Baseline:  Goal status: on-going   6.  PT to be able to go up and down steps in a reciprocal manner with one rail Baseline:  Goal status:on-going     PLAN:   PT FREQUENCY: 2x/week  ortho referral stated 3x pt request 2x as he is completing HEP at home.    PT DURATION: 8 weeks   PLANNED INTERVENTIONS: 97110-Therapeutic exercises, 97530- Therapeutic activity, [id number]- Neuromuscular re-education, 97535- Self Care, [id number]- Manual therapy, [id number]- Gait training, [phone]- Wound care (first 20 sq  cm), and Patient/Family education   PLAN FOR NEXT SESSION: continue to progress strengthening and wound care most likely discharge from wound care next treatment.  Montie Metro, PT CLT [phone]   "

## 2024-05-23 ENCOUNTER — Ambulatory Visit (HOSPITAL_COMMUNITY): Admitting: Physical Therapy

## 2024-05-23 DIAGNOSIS — M6281 Muscle weakness (generalized): Secondary | ICD-10-CM

## 2024-05-23 DIAGNOSIS — R262 Difficulty in walking, not elsewhere classified: Secondary | ICD-10-CM | POA: Diagnosis not present

## 2024-05-23 DIAGNOSIS — M25551 Pain in right hip: Secondary | ICD-10-CM

## 2024-05-23 NOTE — Therapy (Signed)
 " OUTPATIENT PHYSICAL THERAPY LOWER EXTREMITY /Wound Treatment   Patient Name: Lawrnce Wang MRN: [id number] DOB:1960/12/29, 64 y.o., male Today's Date: 05/23/2024  END OF SESSION:  PT End of Session - 05/23/24 0919     Visit Number 7    Number of Visits 16    Date for Recertification  06/28/24    Authorization Type medicare/[employer]    Progress Note Due on Visit 10    PT Start Time 0810    PT Stop Time 0918    PT Time Calculation (min) 68 min    Activity Tolerance Patient tolerated treatment well    Behavior During Therapy [facility] for tasks assessed/performed                 Past Medical History:  Diagnosis Date   History of cholecystectomy 10/11/2020   Vancomycin adverse reaction 02/10/2024   Past Surgical History:  Procedure Laterality Date   CHOLECYSTECTOMY N/A 10/11/2020   Procedure: LAPAROSCOPIC CHOLECYSTECTOMY WITH INTRAOPERATIVE CHOLANGIOGRAM;  Surgeon: Eletha Boas, MD;  Location: [facility];  Service: General;  Laterality: N/A;   COLONOSCOPY     Patient Active Problem List   Diagnosis Date Noted   Closed pelvic fracture (HCC) 05/01/2024   Obesity, Class III, BMI 40-49.9 (HCC) 05/01/2024   Nonhealing surgical wound 05/01/2024   Thoracic vertebral fracture (HCC) 05/01/2024   Colostomy in place [facility]) 03/16/2024   Hypercholesterolemia 12/20/2023    PCP: Jerrell Cleatus Ned, MD  REFERRING PROVIDER: Jerrell Cleatus Ned, MD  REFERRING DIAG: [phone] (ICD-10-CM) - Multiple closed fractures of pelvis with unstable disruption of pelvic ring with delayed healing, subsequent encounter Non healing surgical site wounds. [id number] - AMB REFERRAL TO WOUND CLINIC per MD  THERAPY DIAG:  S32.811G (ICD-10-CM) - Multiple closed fractures of pelvis with unstable disruption of pelvic ring with delayed healing, subsequent encounter Difficulty walking Muscle weakness  Rationale for Evaluation and Treatment: Rehabilitation HX: Connor Wang is a 64 y.o. male who is  [employer] who presented on 12/04/23 s/p a crush injury. He was found to have Left 7,8 and right rib fractures, spine fractures requiring PSF, possible bladder wall injury (cysto without leak) as well as a Traumatic sigmoid colon transection with discontinuous sigmoid colon, mesenteric contusions, R lumbar hernia, Traumatic right posterior abdominal wall defect. He required OR for exploratory laparotomy, sigmoidoscopy, sigmoidectomy small bowel resection colostomy creation 8/2. He was also found to have multiple pelvic fractures which required OR for ex fix then later ORIF and perc screw. His post-op course was complicated by a pelvic wound infection that required debridement and wound vac placement. ID was consulted and he was discharged with IV antibiotics.   PT went to In patient rehab until 9/16 when readmitted due to surgical site infection.  Discharged on 01/26/24 back to IP rehag.      Currently living by himself and independent, back to driving, ambulates with the help of a rolling walker or just a cane at times. He has consultation with orthopedics in 2 days. ONSET DATE: 12/04/23 Hospitalized 12/04/23-12/30/23 In pt rehab:  12/30/23-01/18/24 RE hospitalized on 01/18/24-01/26/24 due to infection from wound site Taken to OR on 9/17 and grew coryne, E Cloacae, Staph Epi. Placed on vancomycin and po cipro and flagyl with plan for 6 weeks with planned end date 10/30  In patient rehab from 9/24-10/30/25 Home with daughter on 03/02/24 Currently living on his own and driving    SUBJECTIVE STATEMENT:see wound care Eval:  Patient states that he was pinned  between a 4x4 and a [employer] truck.  Pt recently went to ortho who states he can be FWB.  Pt is using a rollator to walk with this time.  Goes up steps with his cane one at a time: Can sit:  One hour Walk: With rollator 5 minute  Stand: no problem PERTINENT HISTORY: CXR - no acute findings CT T/L - T8 VB fx, R ileum fx extending into sup R sacral  ala CT C/A/P - L 7-8, R 11th rib fxs, traumatic colon transection with discontinuous sigmoid colon, small volume pneumoperitoneum, mesenteric hematoma, R>L retroperitoneal hematoma, extensive bilat pelvis fracture, displaced bladder traumatic R lumbar hernia containing RP fat and hemorrhage Pelvis xray- displaced obturator ring fxs, R ilaiac fx RLE xray- no femur fx      PAIN: 3/10 Are you having pain? Yes: Pain location: Rt hip  Pain description: sore Aggravating factors: sitting  Relieving factors: rest  PRECAUTIONS: Other: cellulitis   RED FLAGS: None   WEIGHT BEARING RESTRICTIONS: Yes PWB RT  FALLS:  Has patient fallen in last 6 months? No  LIVING ENVIRONMENT: Lives with: lives with their family and lives alone Lives in: House/apartment Stairs: Yes: Internal: 6 steps; on right going up and External: 3 steps; on right going up Has following equipment at home: Single point cane and Walker - 4 wheeled  OCCUPATION: retired   PLOF: Independent  PATIENT GOALS: Wounds to heal as well as to be able to walk without assistive device   NEXT MD VISIT: trying to establish  OBJECTIVE:  Note: Objective measures were completed at Evaluation unless otherwise noted.  DIAGNOSTIC FINDINGS: Per Ortho on 02/08/24:  X-rays taken here today independently reviewed by me, AP of the pelvis with inlet and outlet projections show maintenance of alignment and no failure of fixation compared to the most recent postoperative films.  Fracture line is still evident particularly up at the level of the iliac crest where the bulk of the displacement is.  Overall the alignment of the right hemipelvis is good and is unchanged.   Discussed with the patient I do not feel that there is adequate healing to permit full weightbearing particularly since he still having a fair amount of pain when there is direct pressure on the right side that likely is indicating that there is insufficient healing there for  weightbearing as well. Pt returned to MD on 04/18/24 now FWB COGNITION: Overall cognitive status: Within functional limits for tasks assessed     SENSATION: Not tested  POSTURE: No Significant postural limitations  PALPATION: Noted scar tissue located at all wound sites; pt states scar tissue on his back as well from surgery.   LOWER EXTREMITY ROM: not formally tested   Active ROM Right 05/18/24 Left eval  Hip flexion Via heelslide supine 50;     Hip extension Sidelying to neutral with assist   Hip abduction    Hip adduction    Hip internal rotation    Hip external rotation    Knee flexion    Knee extension *lacking 10   Ankle dorsiflexion    Ankle plantarflexion    Ankle inversion    Ankle eversion     (Blank rows = not tested)  *pt states at home as he relaxes he can get his knee straight.   LOWER EXTREMITY MMT:  MMT Right eval 05/18/24 Left eval  Hip flexion 2- 3-   Hip extension     Hip abduction 2 3-   Hip adduction  Hip internal rotation     Hip external rotation     Knee flexion     Knee extension 5 5 5   Ankle dorsiflexion 4 4+ 5  Ankle plantarflexion     Ankle inversion     Ankle eversion      (Blank rows = not tested)   FUNCTIONAL TESTS:  Eval: 30 seconds chair stand test: uses UE B:  6x; 05/18/24 : 6 with UE assist.   Eval:  2 minute walk test: 236 ft with rollator ; 05/18/24: 298 ft with rollator                                                                                                                              TREATMENT DATE: 05/23/24: 05/23/24  05/23/24 0001  Subjective Assessment   Pt states that he feels that the mat exercises were better for him than the standing exercises; he thinks that his wound is healed now.   To walk without an assistive device.   12/04/23   Acute, IP rehab and HH  Pain Assessment   0-10  Evaluation and Treatment   Yes   agreed to  [REMOVED] Wound 05/03/24 0835 Surgical Closed Surgical Incision Abdomen  Left;Lower  Final Assessment Date/Final Assessment Time: 05/23/24 0826  Date First Assessed/Time First Assessed: 05/03/24 0835   Present on Original Admission: Yes  Primary Wound Type: Surgical  Secondary Wound Type - Surgical: Closed Surgical Incision  Location:...       Nustep level 5 x 10:00] Supine: Heelslides x 10 pt needs manual assist to keep knee towards ceiling.  Once at max pt holds concentrating on IR of hip to keep hip in neutral position.   Quad sets x 10  Active hamstring stretch with towel under thigh 5 x 15  (pt can only tolerate 15 ) Side-lying: Hip abduction x 5 Hip flexion/extension x 10  Sitting: Sit to stand  10  Heel toe gait training     PATIENT EDUCATION:  Education details: HEP (pt also has a HEP for [facility] therapist which is extensive. Standing hamstring curls, squats, hip abduction,  Person educated: Patient Education method: Explanation, Verbal cues, and Handouts HOME EXERCISE PROGRAM: Access Code: [email or web address] URL: [email or web address] Date: 05/03/2024 Prepared by: Montie Metro   Exercises - Supine Bridge  - 2 x daily - 7 x weekly - 1 sets - 5-20 reps - 5 hold - Beginner Side Leg Lift  - 2 x daily - 7 x weekly - 1 sets - 10 reps - 5 hold - Supine Heel Slide  - 2 x daily - 7 x weekly - 1 sets - 20 reps - 5 hold 05/05/24 Clam Sit to stand  05/09/24 Forward Fall Out Lunge  - 1 x daily - 7 x weekly - 1 sets - 10 reps - 5 hold - Forward Step Up with Counter Support  - 1 x daily - 7 x weekly - 1 sets -  10 reps - 5 hold - Lateral Step Up with Counter Support  - 1 x daily - 7 x weekly - 1 sets - 10 reps - 5 hold - Hip Hiking on Step  - 1 x daily - 7 x weekly - 1 sets - 10 reps - 5 hold - Diagonal Hip Extension with Resistance  - 1 x daily - 7 x weekly - 1 sets - 10 reps - 5 hold  ASSESSMENT:   CLINICAL IMPRESSION:  Pt wound completely healed now will discharge from wound care.   Pt has full PROM for knee extension but lacks Active  extension. Pt continues to need occasional verbal cuing to keep in good form while exercising.  Pt has a colostomy; therefore unable to complete prone exercises. Connor Wang continues to have decreased activity tolerance, decreased strength, increased pain, decreased balance, decreased ROM and will continue to  benefit from skilled PT to address these issues and maximize his functional ability.     OBJECTIVE IMPAIRMENTS: Abnormal gait, cardiopulmonary status limiting activity, decreased activity tolerance, decreased balance, decreased mobility, difficulty walking, decreased ROM, decreased strength, pain, and decreased skin integrity.    ACTIVITY LIMITATIONS: carrying, lifting, sitting, stairs, transfers, dressing, reach over head, hygiene/grooming, and locomotion level   PARTICIPATION LIMITATIONS: meal prep, cleaning, shopping, and community activity   PERSONAL FACTORS: Time since onset of injury/illness/exacerbation are also affecting patient's functional outcome.    REHAB POTENTIAL: Good   CLINICAL DECISION MAKING: Stable/uncomplicated   EVALUATION COMPLEXITY: Moderate     GOALS: Goals reviewed with patient? Yes   SHORT TERM GOALS: Target date: 05/31/24 Rt wound to be healed  Baseline: Goal status: met   2.  Pain in sacral area to be no greater than a 2/10 Baseline:  Goal status: on-going     3.  Pt to be able to walk with a rollator for 20 minutes for short shopping trips  Baseline:  Goal status:on-going   4.  PT to tolerate sitting for an hour and a half for traveling  Baseline:  Goal status: on-going   5.  Mm grades to be increased by 1 grade to allow the above to occur.  Baseline:  Goal status: on-going   6.  Pt to be able to walk for 280 ft in a two minute period.  Baseline:  Goal status:on-going   LONG TERM GOALS: Target date: 06/28/24   PT to have no pain in his Rt hip  Baseline:  Goal status: on-going   2.  PT to be able to walk for 30 minutes with a  cane Baseline:  Goal status: on-going   3.  Pt to be able to walk 334ft with a cane in 2 minute period of time.  Baseline:  Goal status:on-going   4.  Pt to be able to come sit to stand without using UE assist.  Baseline:  Goal status: partially met    5.  PT to be able to complete 10 sit to stand in 30 seconds  Baseline:  Goal status: on-going   6.  PT to be able to go up and down steps in a reciprocal manner with one rail Baseline:  Goal status:on-going     PLAN:   PT FREQUENCY: 2x/week  ortho referral stated 3x pt request 2x as he is completing HEP at home.    PT DURATION: 8 weeks   PLANNED INTERVENTIONS: 97110-Therapeutic exercises, 97530- Therapeutic activity, [id number]- Neuromuscular re-education, 97535- Self Care, [id number]- Manual therapy, [phone]- Gait training,  02402- Wound care (first 20 sq cm), and Patient/Family education   PLAN FOR NEXT SESSION: Discharge from wound care. Continue to progress strengthening, ROM and gait training.  Pt will be transferring to [facility]   [place], PT CLT [phone]   "

## 2024-05-25 ENCOUNTER — Ambulatory Visit (HOSPITAL_COMMUNITY): Admitting: Physical Therapy

## 2024-05-25 DIAGNOSIS — R262 Difficulty in walking, not elsewhere classified: Secondary | ICD-10-CM | POA: Diagnosis not present

## 2024-05-25 DIAGNOSIS — M6281 Muscle weakness (generalized): Secondary | ICD-10-CM

## 2024-05-25 DIAGNOSIS — M25651 Stiffness of right hip, not elsewhere classified: Secondary | ICD-10-CM

## 2024-05-25 DIAGNOSIS — M25551 Pain in right hip: Secondary | ICD-10-CM

## 2024-05-25 DIAGNOSIS — T81321D Disruption or dehiscence of closure of internal operation (surgical) wound of abdominal wall muscle or fascia, subsequent encounter: Secondary | ICD-10-CM

## 2024-05-25 NOTE — Therapy (Signed)
 " OUTPATIENT PHYSICAL THERAPY LOWER EXTREMITY /Wound Treatment   Patient Name: Connor Wang MRN: [id number] DOB:06/12/1960, 64 y.o., male Today's Date: 05/25/2024  END OF SESSION:           Past Medical History:  Diagnosis Date   History of cholecystectomy 10/11/2020   Vancomycin adverse reaction 02/10/2024   Past Surgical History:  Procedure Laterality Date   CHOLECYSTECTOMY N/A 10/11/2020   Procedure: LAPAROSCOPIC CHOLECYSTECTOMY WITH INTRAOPERATIVE CHOLANGIOGRAM;  Surgeon: Eletha Boas, MD;  Location: [facility];  Service: General;  Laterality: N/A;   COLONOSCOPY     Patient Active Problem List   Diagnosis Date Noted   Closed pelvic fracture (HCC) 05/01/2024   Obesity, Class III, BMI 40-49.9 (HCC) 05/01/2024   Nonhealing surgical wound 05/01/2024   Thoracic vertebral fracture (HCC) 05/01/2024   Colostomy in place [facility]) 03/16/2024   Hypercholesterolemia 12/20/2023    PCP: Jerrell Cleatus Ned, MD  REFERRING PROVIDER: Jerrell Cleatus Ned, MD  REFERRING DIAG: [phone] (ICD-10-CM) - Multiple closed fractures of pelvis with unstable disruption of pelvic ring with delayed healing, subsequent encounter Non healing surgical site wounds. [id number] - AMB REFERRAL TO WOUND CLINIC per MD  THERAPY DIAG:  S32.811G (ICD-10-CM) - Multiple closed fractures of pelvis with unstable disruption of pelvic ring with delayed healing, subsequent encounter Difficulty walking Muscle weakness  Rationale for Evaluation and Treatment: Rehabilitation HX: Connor Wang is a 64 y.o. male who is Jehovah's witness who presented on 12/04/23 s/p a crush injury. He was found to have Left 7,8 and right rib fractures, spine fractures requiring PSF, possible bladder wall injury (cysto without leak) as well as a Traumatic sigmoid colon transection with discontinuous sigmoid colon, mesenteric contusions, R lumbar hernia, Traumatic right posterior abdominal wall defect. He required OR for exploratory  laparotomy, sigmoidoscopy, sigmoidectomy small bowel resection colostomy creation 8/2. He was also found to have multiple pelvic fractures which required OR for ex fix then later ORIF and perc screw. His post-op course was complicated by a pelvic wound infection that required debridement and wound vac placement. ID was consulted and he was discharged with IV antibiotics.   PT went to In patient rehab until 9/16 when readmitted due to surgical site infection.  Discharged on 01/26/24 back to IP rehag.      Currently living by himself and independent, back to driving, ambulates with the help of a rolling walker or just a cane at times. He has consultation with orthopedics in 2 days. ONSET DATE: 12/04/23 Hospitalized 12/04/23-12/30/23 In pt rehab:  12/30/23-01/18/24 RE hospitalized on 01/18/24-01/26/24 due to infection from wound site Taken to OR on 9/17 and grew coryne, E Cloacae, Staph Epi. Placed on vancomycin and po cipro and flagyl with plan for 6 weeks with planned end date 10/30  In patient rehab from 9/24-10/30/25 Home with daughter on 03/02/24 Currently living on his own and driving    SUBJECTIVE STATEMENT:  Pt states that he is sore as he has been getting ready for the coming storm gathering, gas and propane.  It is still painful to be sitting for prolong periods of time.  States he needs to get back to where he can pick some things up off the floor again.   Eval:  Patient states that he was pinned between a 4x4 and a [employer] truck.  Pt recently went to ortho who states he can be FWB.  Pt is using a rollator to walk with this time.  Goes up steps with his cane one at a  time: Can sit:  One hour Walk: With rollator 5 minute  Stand: no problem PERTINENT HISTORY: CXR - no acute findings CT T/L - T8 VB fx, R ileum fx extending into sup R sacral ala CT C/A/P - L 7-8, R 11th rib fxs, traumatic colon transection with discontinuous sigmoid colon, small volume pneumoperitoneum, mesenteric hematoma, R>L  retroperitoneal hematoma, extensive bilat pelvis fracture, displaced bladder traumatic R lumbar hernia containing RP fat and hemorrhage Pelvis xray- displaced obturator ring fxs, R ilaiac fx RLE xray- no femur fx      PAIN: 3/10 Are you having pain? Yes: Pain location: Rt hip  Pain description: sore Aggravating factors: sitting  Relieving factors: rest  PRECAUTIONS: Other: cellulitis   RED FLAGS: None   WEIGHT BEARING RESTRICTIONS: Yes PWB RT  FALLS:  Has patient fallen in last 6 months? No  LIVING ENVIRONMENT: Lives with: lives with their family and lives alone Lives in: House/apartment Stairs: Yes: Internal: 6 steps; on right going up and External: 3 steps; on right going up Has following equipment at home: Single point cane and Walker - 4 wheeled  OCCUPATION: retired   PLOF: Independent  PATIENT GOALS: Wounds to heal as well as to be able to walk without assistive device   NEXT MD VISIT: trying to establish  OBJECTIVE:  Note: Objective measures were completed at Evaluation unless otherwise noted.  DIAGNOSTIC FINDINGS: Per Ortho on 02/08/24:  X-rays taken here today independently reviewed by me, AP of the pelvis with inlet and outlet projections show maintenance of alignment and no failure of fixation compared to the most recent postoperative films.  Fracture line is still evident particularly up at the level of the iliac crest where the bulk of the displacement is.  Overall the alignment of the right hemipelvis is good and is unchanged.   Discussed with the patient I do not feel that there is adequate healing to permit full weightbearing particularly since he still having a fair amount of pain when there is direct pressure on the right side that likely is indicating that there is insufficient healing there for weightbearing as well. Pt returned to MD on 04/18/24 now FWB COGNITION: Overall cognitive status: Within functional limits for tasks  assessed     SENSATION: Not tested  POSTURE: No Significant postural limitations  PALPATION: Noted scar tissue located at all wound sites; pt states scar tissue on his back as well from surgery.   LOWER EXTREMITY ROM: not formally tested   Active ROM Right 05/18/24 Rt 05/25/24 Left eval  Hip flexion Via heelslide supine 50;      Hip extension Sidelying to neutral with assist    Hip abduction     Hip adduction     Hip internal rotation     Hip external rotation     Knee flexion     Knee extension *lacking 10 Lacking 5   Ankle dorsiflexion     Ankle plantarflexion     Ankle inversion     Ankle eversion      (Blank rows = not tested)  *pt states at home as he relaxes he can get his knee straight.   LOWER EXTREMITY MMT:  MMT Right eval 05/18/24 Left eval  Hip flexion 2- 3-   Hip extension     Hip abduction 2 3-   Hip adduction     Hip internal rotation     Hip external rotation     Knee flexion     Knee extension  5 5 5   Ankle dorsiflexion 4 4+ 5  Ankle plantarflexion     Ankle inversion     Ankle eversion      (Blank rows = not tested)   FUNCTIONAL TESTS:  Eval: 30 seconds chair stand test: uses UE B:  6x; 05/18/24 : 6 with UE assist.; 05/25/24 no UE assist 5    Eval:  2 minute walk test: 236 ft with rollator ; 05/18/24: 298 ft with rollator;  05/25/24 338 ft with rollator.                                                                                                                               TREATMENT DATE: 05/25/24:  Nustep level 5 x 10:00 Supine: Heelslides x 10 pt needs manual assist to keep knee towards ceiling.  Once at max pt holds concentrating on IR of hip to keep hip in neutral position.   Quad sets x 10  Active hamstring stretch with towel under thigh 5 x 15  (pt can only tolerate 15 ) Side-lying: Hip abduction x 5 Hip flexion/extension x 10  Sitting: Sit to stand  15   Standing: Heel raises x 15 Squat x 15  Standing keeping back to wall  raise arms above shoulder height and hold 5 x 10 Functional testing above.    PATIENT EDUCATION:  Education details: HEP (pt also has a HEP for [facility] therapist which is extensive. Standing hamstring curls, squats, hip abduction,  Person educated: Patient Education method: Explanation, Verbal cues, and Handouts HOME EXERCISE PROGRAM: Access Code: [email or web address] URL: [email or web address] Date: 05/03/2024 Prepared by: Montie Metro   Exercises - Supine Bridge  - 2 x daily - 7 x weekly - 1 sets - 5-20 reps - 5 hold - Beginner Side Leg Lift  - 2 x daily - 7 x weekly - 1 sets - 10 reps - 5 hold - Supine Heel Slide  - 2 x daily - 7 x weekly - 1 sets - 20 reps - 5 hold 05/05/24 Clam Sit to stand  05/09/24 Forward Fall Out Lunge  - 1 x daily - 7 x weekly - 1 sets - 10 reps - 5 hold - Forward Step Up with Counter Support  - 1 x daily - 7 x weekly - 1 sets - 10 reps - 5 hold - Lateral Step Up with Counter Support  - 1 x daily - 7 x weekly - 1 sets - 10 reps - 5 hold - Hip Hiking on Step  - 1 x daily - 7 x weekly - 1 sets - 10 reps - 5 hold - Diagonal Hip Extension with Resistance  - 1 x daily - 7 x weekly - 1 sets - 10 reps - 5 hold  ASSESSMENT:   CLINICAL IMPRESSION:  Pt comes into department using proper heel toe gait as instructed last session.   Pt has full PROM for knee extension but lacks Active extension.  Pt continues to need occasional verbal cuing to keep in good form while exercising.  Pt has a colostomy; therefore unable to complete prone exercises. Connor Wang continues to have decreased activity tolerance, decreased strength, increased pain, decreased balance, decreased ROM and will continue to  benefit from skilled PT to address these issues and maximize his functional ability.     OBJECTIVE IMPAIRMENTS: Abnormal gait, cardiopulmonary status limiting activity, decreased activity tolerance, decreased balance, decreased mobility, difficulty walking, decreased ROM,  decreased strength, pain, and decreased skin integrity.    ACTIVITY LIMITATIONS: carrying, lifting, sitting, stairs, transfers, dressing, reach over head, hygiene/grooming, and locomotion level   PARTICIPATION LIMITATIONS: meal prep, cleaning, shopping, and community activity   PERSONAL FACTORS: Time since onset of injury/illness/exacerbation are also affecting patient's functional outcome.    REHAB POTENTIAL: Good   CLINICAL DECISION MAKING: Stable/uncomplicated   EVALUATION COMPLEXITY: Moderate     GOALS: Goals reviewed with patient? Yes   SHORT TERM GOALS: Target date: 05/31/24 Rt wound to be healed  Baseline: Goal status: met   2.  Pain in sacral area to be no greater than a 2/10 Baseline:  Goal status: on-going     3.  Pt to be able to walk with a rollator for 20 minutes for short shopping trips  Baseline:  Goal status:on-going   4.  PT to tolerate sitting for an hour and a half for traveling  Baseline:  Goal status: on-going   5.  Mm grades to be increased by 1 grade to allow the above to occur.  Baseline:  Goal status: on-going   6.  Pt to be able to walk for 280 ft in a two minute period.  Baseline:  Goal status:on-going   LONG TERM GOALS: Target date: 06/28/24   PT to have no pain in his Rt hip  Baseline:  Goal status: on-going   2.  PT to be able to walk for 30 minutes with a cane Baseline:  Goal status: on-going   3.  Pt to be able to walk 379ft with a cane in 2 minute period of time.  Baseline:  Goal status:on-going   4.  Pt to be able to come sit to stand without using UE assist.  Baseline:  Goal status: partially met    5.  PT to be able to complete 10 sit to stand in 30 seconds  Baseline:  Goal status: on-going   6.  PT to be able to go up and down steps in a reciprocal manner with one rail Baseline:  Goal status:on-going     PLAN:   PT FREQUENCY: 2x/week  ortho referral stated 3x pt request 2x as he is completing HEP at home.     PT DURATION: 8 weeks   PLANNED INTERVENTIONS: 97110-Therapeutic exercises, 97530- Therapeutic activity, [id number]- Neuromuscular re-education, 97535- Self Care, [id number]- Manual therapy, [id number]- Gait training, [id number]- Wound care (first 20 sq cm), and Patient/Family education   PLAN FOR NEXT SESSION:  Continue to progress strengthening, ROM and gait training.  Pt will be transferring to [facility]   [place], PT CLT [phone]   "

## 2024-05-26 ENCOUNTER — Ambulatory Visit (HOSPITAL_BASED_OUTPATIENT_CLINIC_OR_DEPARTMENT_OTHER): Admitting: Orthopaedic Surgery

## 2024-05-29 ENCOUNTER — Ambulatory Visit: Admitting: Student in an Organized Health Care Education/Training Program

## 2024-05-29 ENCOUNTER — Encounter: Payer: Self-pay | Admitting: Student in an Organized Health Care Education/Training Program

## 2024-05-29 VITALS — BP 125/74 | Ht 69.0 in | Wt 285.0 lb

## 2024-05-29 DIAGNOSIS — S32811G Multiple fractures of pelvis with unstable disruption of pelvic ring, subsequent encounter for fracture with delayed healing: Secondary | ICD-10-CM

## 2024-05-29 DIAGNOSIS — Z933 Colostomy status: Secondary | ICD-10-CM

## 2024-05-29 DIAGNOSIS — Z23 Encounter for immunization: Secondary | ICD-10-CM | POA: Diagnosis not present

## 2024-05-29 NOTE — Assessment & Plan Note (Signed)
 Chronic and improving very nicely.  Had a terrible accident in August.  He is ambulating with the help of a walker.  He has been going to physical therapy in [place].  Making really nice progress.  His pain is well-managed.  No longer requiring oxycodone  or NSAIDs.  Becoming more functional each week.  He had some nonhealing surgical scars which are now well-healed.  He is completing the long course of antibiotics later this week.  Overall I am very pleased with his progress.

## 2024-05-29 NOTE — Assessment & Plan Note (Signed)
 Colostomy output is stable, healthy, seems to be doing well.  We placed a referral to general surgery to consider ostomy takedown.  Seems like you are still a little too early from his orthopedic pelvic fractures.  He has follow-up with orthopedics in March.  Doing really well from recovery of the fractures.  I think if he continues to do well in March, could get cleared for takedown of the ostomy.

## 2024-05-29 NOTE — Patient Instructions (Signed)
" °  VISIT SUMMARY: During your follow-up appointment, we discussed the progress of your wound healing and physical therapy. Your wounds have healed well, and you have made significant progress in physical therapy, although you experience some soreness. You have also seen improvement in your walking distance. Your colostomy is functioning well, though you noted some mild soreness.  YOUR PLAN: -MULTIPLE CLOSED PELVIC FRACTURES WITH DELAYED HEALING: This means you had several fractures in your pelvis that took longer than usual to heal. Your healing is progressing well, and your mobility has improved. Continue with physical therapy and use acetaminophen  (Tylenol ) as needed for pain management.  -COLOSTOMY IN PLACE: A colostomy is a surgical procedure where an opening is made in your abdomen to allow waste to leave your body. Your colostomy is functioning well with consistent output, though you have noted some mild soreness. Continue using Miralax, Senna twice daily, and vitamin C as needed.  INSTRUCTIONS: Please continue with your current physical therapy routine and pain management with acetaminophen  as needed. Complete the remaining three days of your doxycycline and ciprofloxacin course. Continue using Miralax or Clearlax to manage your ostomy output, and take Senna twice daily and vitamin C as needed. If you experience any new or worsening symptoms, please contact our office.    Contains text generated by [employer].   "

## 2024-05-29 NOTE — Progress Notes (Signed)
 "                    MyChart Video Visit    Virtual Visit via Video Note   This format is felt to be most appropriate for this patient at this time. Physical exam was limited by quality of the video and audio technology used for the visit.    Patient location: home Provider location: [facility] Persons involved in the visit: patient, provider  I discussed the limitations of evaluation and management by telemedicine and the availability of in person appointments. The patient expressed understanding and agreed to proceed.  Patient: Connor Wang   DOB: February 28, 1961   64 y.o. Male  MRN: [id number] Visit Date: 05/29/2024  Today's healthcare provider: Cleatus Debby Specking, MD   Chief Complaint  Patient presents with   Follow-up    4 week follow up visit. Patient reports he is sore but feels alright. Patient will be finished with abx wednesday    Subjective:    HPI  Discussed the use of AI scribe software for clinical note transcription with the patient, who gave verbal consent to proceed.  History of Present Illness Connor Wang is a 64 year old male who presents for follow-up on wound healing and physical therapy progress.  His wounds on the lower abdomen, right and left thigh, and left center have healed. He has been engaging in physical therapy and notes significant progress, although he experiences soreness, particularly in his lower back and both hips. He attributes some of the soreness to increased physical activity, including moving heavy items around his house.  He has been using a rollator for mobility and reports an improvement in his walking distance, increasing from 236 feet to 338 feet in two minutes over the past month. He continues to use Tylenol  for pain management and has not used oxycodone  recently.  He is currently completing a course of doxycycline and ciprofloxacin, with three days remaining. He also  uses Miralax or Clearlax to manage ostomy output, which becomes thick without it. He reports the ostomy is generally okay but notes some soreness on one side.  He is retired and previously worked with arts administrator. He lives independently and manages his daily activities on his own.     Objective:    BP 125/74   Ht 5' 9 (1.753 m)   Wt 285 lb (129.3 kg)   BMI 42.09 kg/m    Physical Exam   Gen: Well-appearing man Resp: Unlabored, normal work of breathing Neuro: Alert, conversational, full strength upper extremities, not sedated appearing Psych: Appropriate mood and affect, not anxious or depressed appearing    Assessment & Plan:    Problem List Items Addressed This Visit       High   Colostomy in place [facility]) (Chronic)   Colostomy output is stable, healthy, seems to be doing well.  We placed a referral to general surgery to consider ostomy takedown.  Seems like you are still a little too early from his orthopedic pelvic fractures.  He has follow-up with orthopedics in March.  Doing really well from recovery of the fractures.  I think if he continues to do well in March, could get cleared for takedown of the ostomy.      Closed pelvic fracture (HCC) - Primary (Chronic)   Chronic and improving very nicely.  Had a terrible accident in August.  He is ambulating with the help of a walker.  He has been going to physical therapy in [place].  Making really nice progress.  His pain is well-managed.  No longer requiring oxycodone  or NSAIDs.  Becoming more functional each week.  He had some nonhealing surgical scars which are now well-healed.  He is completing the long course of antibiotics later this week.  Overall I am very pleased with his progress.      Other Visit Diagnoses       Need for hepatitis vaccination           Return in about 3 months (around 08/27/2024).     I discussed the assessment and treatment plan with the patient. The patient was provided an opportunity to ask  questions and all were answered. The patient agreed with the plan and demonstrated an understanding of the instructions.   The patient was advised to call back or seek an in-person evaluation if the symptoms worsen or if the condition fails to improve as anticipated.  I provided 11 minutes of non-face-to-face time during this encounter.  Cleatus Debby Specking, MD [facility]    "

## 2024-05-30 ENCOUNTER — Ambulatory Visit (HOSPITAL_COMMUNITY): Admitting: Physical Therapy

## 2024-05-30 ENCOUNTER — Ambulatory Visit: Admitting: *Deleted

## 2024-06-01 ENCOUNTER — Ambulatory Visit (HOSPITAL_COMMUNITY): Admitting: Physical Therapy

## 2024-06-02 ENCOUNTER — Ambulatory Visit

## 2024-06-13 ENCOUNTER — Ambulatory Visit: Admitting: Physical Therapy

## 2024-06-16 ENCOUNTER — Ambulatory Visit: Admitting: *Deleted

## 2024-07-04 ENCOUNTER — Ambulatory Visit: Admitting: Orthopedic Surgery
